# Patient Record
Sex: Female | Born: 1945 | Race: White | Hispanic: No | Marital: Married | Smoking: Never smoker
Health system: Southern US, Community
[De-identification: ages and names within clinical notes are randomized; demographics above are authoritative.]

## PROBLEM LIST (undated history)

## (undated) DIAGNOSIS — R600 Localized edema: Secondary | ICD-10-CM

## (undated) DIAGNOSIS — B372 Candidiasis of skin and nail: Secondary | ICD-10-CM

## (undated) DIAGNOSIS — C4491 Basal cell carcinoma of skin, unspecified: Secondary | ICD-10-CM

## (undated) DIAGNOSIS — J209 Acute bronchitis, unspecified: Secondary | ICD-10-CM

## (undated) DIAGNOSIS — E785 Hyperlipidemia, unspecified: Secondary | ICD-10-CM

## (undated) DIAGNOSIS — K219 Gastro-esophageal reflux disease without esophagitis: Secondary | ICD-10-CM

## (undated) DIAGNOSIS — G5600 Carpal tunnel syndrome, unspecified upper limb: Secondary | ICD-10-CM

## (undated) DIAGNOSIS — E559 Vitamin D deficiency, unspecified: Secondary | ICD-10-CM

## (undated) DIAGNOSIS — F32A Depression, unspecified: Secondary | ICD-10-CM

## (undated) DIAGNOSIS — M1711 Unilateral primary osteoarthritis, right knee: Secondary | ICD-10-CM

## (undated) DIAGNOSIS — R112 Nausea with vomiting, unspecified: Secondary | ICD-10-CM

## (undated) DIAGNOSIS — J309 Allergic rhinitis, unspecified: Secondary | ICD-10-CM

## (undated) DIAGNOSIS — T8859XA Other complications of anesthesia, initial encounter: Secondary | ICD-10-CM

## (undated) DIAGNOSIS — N3281 Overactive bladder: Secondary | ICD-10-CM

## (undated) DIAGNOSIS — Z9889 Other specified postprocedural states: Secondary | ICD-10-CM

## (undated) DIAGNOSIS — J449 Chronic obstructive pulmonary disease, unspecified: Secondary | ICD-10-CM

## (undated) DIAGNOSIS — R0600 Dyspnea, unspecified: Secondary | ICD-10-CM

## (undated) DIAGNOSIS — E538 Deficiency of other specified B group vitamins: Secondary | ICD-10-CM

## (undated) DIAGNOSIS — T4145XA Adverse effect of unspecified anesthetic, initial encounter: Secondary | ICD-10-CM

## (undated) DIAGNOSIS — J45909 Unspecified asthma, uncomplicated: Secondary | ICD-10-CM

## (undated) DIAGNOSIS — R7303 Prediabetes: Secondary | ICD-10-CM

## (undated) DIAGNOSIS — J040 Acute laryngitis: Secondary | ICD-10-CM

## (undated) DIAGNOSIS — I447 Left bundle-branch block, unspecified: Secondary | ICD-10-CM

## (undated) DIAGNOSIS — F419 Anxiety disorder, unspecified: Secondary | ICD-10-CM

## (undated) DIAGNOSIS — F329 Major depressive disorder, single episode, unspecified: Secondary | ICD-10-CM

## (undated) HISTORY — DX: Candidiasis of skin and nail: B37.2

## (undated) HISTORY — DX: Chronic obstructive pulmonary disease, unspecified: J44.9

## (undated) HISTORY — DX: Deficiency of other specified B group vitamins: E53.8

## (undated) HISTORY — DX: Vitamin D deficiency, unspecified: E55.9

## (undated) HISTORY — DX: Unilateral primary osteoarthritis, right knee: M17.11

## (undated) HISTORY — DX: Anxiety disorder, unspecified: F41.9

## (undated) HISTORY — DX: Acute laryngitis: J04.0

## (undated) HISTORY — DX: Allergic rhinitis, unspecified: J30.9

## (undated) HISTORY — DX: Hyperlipidemia, unspecified: E78.5

## (undated) HISTORY — DX: Overactive bladder: N32.81

## (undated) HISTORY — DX: Depression, unspecified: F32.A

## (undated) HISTORY — DX: Localized edema: R60.0

## (undated) HISTORY — DX: Gastro-esophageal reflux disease without esophagitis: K21.9

## (undated) HISTORY — DX: Major depressive disorder, single episode, unspecified: F32.9

## (undated) HISTORY — PX: KNEE SURGERY: SHX244

## (undated) HISTORY — DX: Unspecified asthma, uncomplicated: J45.909

## (undated) HISTORY — PX: JOINT REPLACEMENT: SHX530

## (undated) HISTORY — PX: OTHER SURGICAL HISTORY: SHX169

## (undated) HISTORY — DX: Acute bronchitis, unspecified: J20.9

---

## 1973-01-12 HISTORY — PX: PARTIAL HYSTERECTOMY: SHX80

## 1973-01-12 HISTORY — PX: ABDOMINAL HYSTERECTOMY: SHX81

## 1998-01-12 HISTORY — PX: CARPAL TUNNEL RELEASE: SHX101

## 2003-05-01 ENCOUNTER — Encounter: Payer: Self-pay | Admitting: Internal Medicine

## 2003-12-28 ENCOUNTER — Ambulatory Visit: Payer: Self-pay | Admitting: Internal Medicine

## 2005-10-08 ENCOUNTER — Ambulatory Visit: Payer: Self-pay | Admitting: Internal Medicine

## 2006-09-06 ENCOUNTER — Encounter: Payer: Self-pay | Admitting: Internal Medicine

## 2006-10-08 ENCOUNTER — Ambulatory Visit (HOSPITAL_BASED_OUTPATIENT_CLINIC_OR_DEPARTMENT_OTHER): Admission: RE | Admit: 2006-10-08 | Discharge: 2006-10-08 | Payer: Self-pay | Admitting: Orthopedic Surgery

## 2007-05-16 ENCOUNTER — Encounter: Payer: Self-pay | Admitting: Internal Medicine

## 2007-11-09 ENCOUNTER — Ambulatory Visit: Payer: Self-pay | Admitting: Internal Medicine

## 2008-04-23 ENCOUNTER — Ambulatory Visit: Payer: Self-pay | Admitting: Internal Medicine

## 2008-04-23 DIAGNOSIS — IMO0002 Reserved for concepts with insufficient information to code with codable children: Secondary | ICD-10-CM | POA: Insufficient documentation

## 2008-04-23 DIAGNOSIS — K5730 Diverticulosis of large intestine without perforation or abscess without bleeding: Secondary | ICD-10-CM | POA: Insufficient documentation

## 2008-04-23 LAB — CONVERTED CEMR LAB
Blood in Urine, dipstick: NEGATIVE
Glucose, Urine, Semiquant: NEGATIVE
Nitrite: NEGATIVE
Specific Gravity, Urine: 1.01
WBC Urine, dipstick: NEGATIVE
pH: 6

## 2008-05-02 ENCOUNTER — Encounter (INDEPENDENT_AMBULATORY_CARE_PROVIDER_SITE_OTHER): Payer: Self-pay | Admitting: *Deleted

## 2008-05-02 ENCOUNTER — Telehealth (INDEPENDENT_AMBULATORY_CARE_PROVIDER_SITE_OTHER): Payer: Self-pay | Admitting: *Deleted

## 2008-05-02 LAB — CONVERTED CEMR LAB
ALT: 22 units/L (ref 0–35)
AST: 31 units/L (ref 0–37)
Albumin: 4.1 g/dL (ref 3.5–5.2)
Chloride: 107 meq/L (ref 96–112)
Eosinophils Relative: 3.7 % (ref 0.0–5.0)
GFR calc non Af Amer: 66.9 mL/min (ref >60)
Glucose, Bld: 97 mg/dL (ref 70–99)
HCT: 40.5 % (ref 36.0–46.0)
HDL: 57.8 mg/dL (ref >39.00)
Hemoglobin: 14 g/dL (ref 12.0–15.0)
Lymphs Abs: 2.4 10*3/uL (ref 0.7–4.0)
MCV: 92.5 fL (ref 78.0–100.0)
Monocytes Absolute: 0.4 10*3/uL (ref 0.1–1.0)
Monocytes Relative: 7.6 % (ref 3.0–12.0)
Neutro Abs: 2.8 10*3/uL (ref 1.4–7.7)
Potassium: 4.9 meq/L (ref 3.5–5.1)
Sodium: 144 meq/L (ref 135–145)
TSH: 2.23 microintl units/mL (ref 0.35–5.50)
Total Protein: 7.4 g/dL (ref 6.0–8.3)
VLDL: 16.2 mg/dL (ref 0.0–40.0)
WBC: 5.9 10*3/uL (ref 4.5–10.5)

## 2008-09-21 ENCOUNTER — Ambulatory Visit (HOSPITAL_BASED_OUTPATIENT_CLINIC_OR_DEPARTMENT_OTHER): Admission: RE | Admit: 2008-09-21 | Discharge: 2008-09-21 | Payer: Self-pay | Admitting: Orthopedic Surgery

## 2010-05-27 NOTE — Op Note (Signed)
NAMEAYAN, YANKEY                 ACCOUNT NO.:  [id number]   MEDICAL RECORD NO.:  [id number]          PATIENT TYPE:  AMB   LOCATION:  DSC                          FACILITY:  [facility]   PHYSICIAN:  Katy Fitch. Sypher, M.D. DATE OF BIRTH:  [birth year]   DATE OF PROCEDURE:  10/08/2006  DATE OF DISCHARGE:                               OPERATIVE REPORT   PREOPERATIVE DIAGNOSES:  1. 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  2. Entrapped neuropathy, right median nerve at carpal tunnel.   POSTOPERATIVE DIAGNOSES:  1. 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  2. Entrapped neuropathy, right median nerve at carpal tunnel.   OPERATION/PROCEDURE:  1. Release of left transverse carpal ligament.  2. Injection of right ulnar bursa with Depo-Medrol and 1% lidocaine      without epinephrine.   OPERATIONS:  Katy Fitch. Sypher, M.D.   ASSISTANT:  Molly Maduro Dasnoit PA-C.   ANESTHESIA:  General by LMA.   SUPERVISING ANESTHESIOLOGIST:  Quita Skye. Krista Blue, M.D.   INDICATIONS:  Janet Diaz is a 65 year old woman referred through the  courtesy of Dr. Lona Kettle for evaluation and management of bilateral  carpal tunnel syndrome.  Clinical examination revealed signs of  bilateral carpal tunnel syndrome.  Electrodiagnostic studies confirmed  median neuropathy.  Due to failed respond to nonoperative measures, she  is brought to operating room at this time for release of her left  transverse carpal ligament and an injection of her right ulnar bursa  while under anesthesia.   Preoperatively she was interviewed in the holding area.  Questions  regarding anticipated procedure invited and answered in detail.   DESCRIPTION OF PROCEDURE:  Uliana Brinker is brought to the operating room  and placed in supine position on the table.  Following anesthesia  consult with Dr. Krista Blue, general anesthesia by LMA technique was  recommended and accepted.   Mrs. Brys was transferred 65  to room #1 one at the [facility],  placed in supine position on the table and under Dr. Robina Ade direct  supervision, general anesthesia by LMA technique induced.   Her left arm was prepped with Betadine soap and solution, sterilely  draped.  A pneumatic tourniquet was applied to the proximal brachium.  While prep was proceeding on the left side, I proceeded to inject the  right ulnar bursa with her fingers in a fist position in the line of the  ring finger beginning 1.5 cm proximal to the distal wrist flexion  crease. The needle was directed towards the ulnar bursa.  The fingers  were extended and a 2 mL mixture of 0.5 mL of Depo-Medrol 40 mg per mL  and 1.5 mL of 1% plain lidocaine were injected without complication.   Attention then directed to the left arm.  Left arm was exsanguinated  with Esmarch bandage and an arterial tourniquet on the proximal brachium  inflated to 230 mmHg.  Procedure commenced with a short incision in line  of the ring finger of the palm.  Subcutaneous tissues were carefully via  the palmar fascia.  This was split  longitudinally to the common sensory  branches of the median nerve.  These were followed back to transcarpal  ligament which gently isolated the median nerve.  The ligament was  released with scissors sequentially along its ulnar border until the  volar forearm fascia was reached.  The fascia was then released  subcutaneously with scissors extending into the distal forearm 4 cm.   This widely opened carpal canal.  The contents of the canal inspected.  The ulnar bursa was thickened and fibrotic.  The bursa was opaque,  rendering visualization of the tendons challenging.  There no other  anatomic predicaments noted.   The wound was then repaired with intradermal 3-0 Prolene.  A compressive  dressing was applied with a volar plaster splint maintaining the wrist  in 5 degrees of dorsiflexion.   For aftercare, Ms. Pfeifer is provided a  prescription for Percocet 5 mg 1  tablet p.o. q.4-6 h p.r.n. pain, 20 tablets without refill.   She will return to our office for followup in 1 week or sooner if  problems.      Katy Fitch Sypher, M.D.  Electronically Signed     RVS/MEDQ  D:  10/08/2006  T:  10/08/2006  Job:  [id number]   cc:   Titus Dubin. Alwyn Ren, MD,FACP,FCCP

## 2010-10-23 LAB — POCT HEMOGLOBIN-HEMACUE: Operator id: 208731

## 2011-01-15 DIAGNOSIS — E538 Deficiency of other specified B group vitamins: Secondary | ICD-10-CM | POA: Diagnosis not present

## 2011-03-16 DIAGNOSIS — D510 Vitamin B12 deficiency anemia due to intrinsic factor deficiency: Secondary | ICD-10-CM | POA: Diagnosis not present

## 2011-03-16 DIAGNOSIS — J040 Acute laryngitis: Secondary | ICD-10-CM | POA: Diagnosis not present

## 2011-03-16 DIAGNOSIS — F39 Unspecified mood [affective] disorder: Secondary | ICD-10-CM | POA: Diagnosis not present

## 2011-03-16 DIAGNOSIS — J309 Allergic rhinitis, unspecified: Secondary | ICD-10-CM | POA: Diagnosis not present

## 2011-05-12 DIAGNOSIS — F39 Unspecified mood [affective] disorder: Secondary | ICD-10-CM | POA: Diagnosis not present

## 2011-05-12 DIAGNOSIS — E538 Deficiency of other specified B group vitamins: Secondary | ICD-10-CM | POA: Diagnosis not present

## 2011-05-12 DIAGNOSIS — M1710 Unilateral primary osteoarthritis, unspecified knee: Secondary | ICD-10-CM | POA: Diagnosis not present

## 2011-05-12 DIAGNOSIS — G4700 Insomnia, unspecified: Secondary | ICD-10-CM | POA: Diagnosis not present

## 2011-06-04 DIAGNOSIS — Z6837 Body mass index (BMI) 37.0-37.9, adult: Secondary | ICD-10-CM | POA: Diagnosis not present

## 2011-06-04 DIAGNOSIS — J040 Acute laryngitis: Secondary | ICD-10-CM | POA: Diagnosis not present

## 2011-06-04 DIAGNOSIS — J309 Allergic rhinitis, unspecified: Secondary | ICD-10-CM | POA: Diagnosis not present

## 2011-06-04 DIAGNOSIS — J019 Acute sinusitis, unspecified: Secondary | ICD-10-CM | POA: Diagnosis not present

## 2011-07-02 DIAGNOSIS — F39 Unspecified mood [affective] disorder: Secondary | ICD-10-CM | POA: Diagnosis not present

## 2011-07-02 DIAGNOSIS — N318 Other neuromuscular dysfunction of bladder: Secondary | ICD-10-CM | POA: Diagnosis not present

## 2011-07-02 DIAGNOSIS — E538 Deficiency of other specified B group vitamins: Secondary | ICD-10-CM | POA: Diagnosis not present

## 2011-07-02 DIAGNOSIS — E782 Mixed hyperlipidemia: Secondary | ICD-10-CM | POA: Diagnosis not present

## 2011-09-08 DIAGNOSIS — L570 Actinic keratosis: Secondary | ICD-10-CM | POA: Diagnosis not present

## 2011-09-08 DIAGNOSIS — L821 Other seborrheic keratosis: Secondary | ICD-10-CM | POA: Diagnosis not present

## 2011-10-01 DIAGNOSIS — R928 Other abnormal and inconclusive findings on diagnostic imaging of breast: Secondary | ICD-10-CM | POA: Diagnosis not present

## 2011-12-29 DIAGNOSIS — R609 Edema, unspecified: Secondary | ICD-10-CM | POA: Diagnosis not present

## 2011-12-29 DIAGNOSIS — Z79899 Other long term (current) drug therapy: Secondary | ICD-10-CM | POA: Diagnosis not present

## 2011-12-29 DIAGNOSIS — Z6837 Body mass index (BMI) 37.0-37.9, adult: Secondary | ICD-10-CM | POA: Diagnosis not present

## 2011-12-29 DIAGNOSIS — J309 Allergic rhinitis, unspecified: Secondary | ICD-10-CM | POA: Diagnosis not present

## 2011-12-29 DIAGNOSIS — F39 Unspecified mood [affective] disorder: Secondary | ICD-10-CM | POA: Diagnosis not present

## 2012-01-13 HISTORY — PX: WRIST SURGERY: SHX841

## 2012-01-13 HISTORY — PX: CATARACT EXTRACTION: SUR2

## 2012-01-18 DIAGNOSIS — J019 Acute sinusitis, unspecified: Secondary | ICD-10-CM | POA: Diagnosis not present

## 2012-01-18 DIAGNOSIS — J029 Acute pharyngitis, unspecified: Secondary | ICD-10-CM | POA: Diagnosis not present

## 2012-01-18 DIAGNOSIS — R920 Mammographic microcalcification found on diagnostic imaging of breast: Secondary | ICD-10-CM | POA: Diagnosis not present

## 2012-01-18 DIAGNOSIS — R928 Other abnormal and inconclusive findings on diagnostic imaging of breast: Secondary | ICD-10-CM | POA: Diagnosis not present

## 2012-01-18 DIAGNOSIS — R509 Fever, unspecified: Secondary | ICD-10-CM | POA: Diagnosis not present

## 2012-01-24 DIAGNOSIS — M546 Pain in thoracic spine: Secondary | ICD-10-CM | POA: Diagnosis not present

## 2012-01-24 DIAGNOSIS — S52599A Other fractures of lower end of unspecified radius, initial encounter for closed fracture: Secondary | ICD-10-CM | POA: Diagnosis not present

## 2012-01-24 DIAGNOSIS — Z23 Encounter for immunization: Secondary | ICD-10-CM | POA: Diagnosis not present

## 2012-01-24 DIAGNOSIS — IMO0002 Reserved for concepts with insufficient information to code with codable children: Secondary | ICD-10-CM | POA: Diagnosis not present

## 2012-01-24 DIAGNOSIS — W010XXA Fall on same level from slipping, tripping and stumbling without subsequent striking against object, initial encounter: Secondary | ICD-10-CM | POA: Diagnosis not present

## 2012-01-24 DIAGNOSIS — M549 Dorsalgia, unspecified: Secondary | ICD-10-CM | POA: Diagnosis not present

## 2012-01-26 DIAGNOSIS — S52599A Other fractures of lower end of unspecified radius, initial encounter for closed fracture: Secondary | ICD-10-CM | POA: Diagnosis not present

## 2012-01-27 DIAGNOSIS — Y939 Activity, unspecified: Secondary | ICD-10-CM | POA: Diagnosis not present

## 2012-01-27 DIAGNOSIS — X58XXXA Exposure to other specified factors, initial encounter: Secondary | ICD-10-CM | POA: Diagnosis not present

## 2012-01-27 DIAGNOSIS — Y929 Unspecified place or not applicable: Secondary | ICD-10-CM | POA: Diagnosis not present

## 2012-01-27 DIAGNOSIS — S52599A Other fractures of lower end of unspecified radius, initial encounter for closed fracture: Secondary | ICD-10-CM | POA: Diagnosis not present

## 2012-01-27 DIAGNOSIS — Y999 Unspecified external cause status: Secondary | ICD-10-CM | POA: Diagnosis not present

## 2012-02-09 DIAGNOSIS — S52599A Other fractures of lower end of unspecified radius, initial encounter for closed fracture: Secondary | ICD-10-CM | POA: Diagnosis not present

## 2012-02-23 DIAGNOSIS — S52599A Other fractures of lower end of unspecified radius, initial encounter for closed fracture: Secondary | ICD-10-CM | POA: Diagnosis not present

## 2012-02-29 DIAGNOSIS — M545 Low back pain: Secondary | ICD-10-CM | POA: Diagnosis not present

## 2012-03-02 DIAGNOSIS — E559 Vitamin D deficiency, unspecified: Secondary | ICD-10-CM | POA: Diagnosis not present

## 2012-03-02 DIAGNOSIS — E785 Hyperlipidemia, unspecified: Secondary | ICD-10-CM | POA: Diagnosis not present

## 2012-03-02 DIAGNOSIS — Z0000 Encounter for general adult medical examination without abnormal findings: Secondary | ICD-10-CM | POA: Diagnosis not present

## 2012-03-07 DIAGNOSIS — M899 Disorder of bone, unspecified: Secondary | ICD-10-CM | POA: Diagnosis not present

## 2012-03-07 DIAGNOSIS — Z13820 Encounter for screening for osteoporosis: Secondary | ICD-10-CM | POA: Diagnosis not present

## 2012-03-10 DIAGNOSIS — E538 Deficiency of other specified B group vitamins: Secondary | ICD-10-CM | POA: Diagnosis not present

## 2012-03-10 DIAGNOSIS — L905 Scar conditions and fibrosis of skin: Secondary | ICD-10-CM | POA: Diagnosis not present

## 2012-03-10 DIAGNOSIS — M545 Low back pain, unspecified: Secondary | ICD-10-CM | POA: Diagnosis not present

## 2012-03-22 DIAGNOSIS — S52599A Other fractures of lower end of unspecified radius, initial encounter for closed fracture: Secondary | ICD-10-CM | POA: Diagnosis not present

## 2012-03-22 DIAGNOSIS — G5600 Carpal tunnel syndrome, unspecified upper limb: Secondary | ICD-10-CM | POA: Diagnosis not present

## 2012-03-22 DIAGNOSIS — M19049 Primary osteoarthritis, unspecified hand: Secondary | ICD-10-CM | POA: Diagnosis not present

## 2012-04-05 DIAGNOSIS — L570 Actinic keratosis: Secondary | ICD-10-CM | POA: Diagnosis not present

## 2012-04-13 DIAGNOSIS — M5430 Sciatica, unspecified side: Secondary | ICD-10-CM | POA: Diagnosis not present

## 2012-04-13 DIAGNOSIS — IMO0002 Reserved for concepts with insufficient information to code with codable children: Secondary | ICD-10-CM | POA: Diagnosis not present

## 2012-04-21 DIAGNOSIS — S52599A Other fractures of lower end of unspecified radius, initial encounter for closed fracture: Secondary | ICD-10-CM | POA: Diagnosis not present

## 2012-05-30 DIAGNOSIS — L989 Disorder of the skin and subcutaneous tissue, unspecified: Secondary | ICD-10-CM | POA: Diagnosis not present

## 2012-05-30 DIAGNOSIS — J309 Allergic rhinitis, unspecified: Secondary | ICD-10-CM | POA: Diagnosis not present

## 2012-05-30 DIAGNOSIS — Z6837 Body mass index (BMI) 37.0-37.9, adult: Secondary | ICD-10-CM | POA: Diagnosis not present

## 2012-05-30 DIAGNOSIS — K219 Gastro-esophageal reflux disease without esophagitis: Secondary | ICD-10-CM | POA: Diagnosis not present

## 2012-06-10 DIAGNOSIS — J0100 Acute maxillary sinusitis, unspecified: Secondary | ICD-10-CM | POA: Diagnosis not present

## 2012-06-10 DIAGNOSIS — J209 Acute bronchitis, unspecified: Secondary | ICD-10-CM | POA: Diagnosis not present

## 2012-06-10 DIAGNOSIS — J029 Acute pharyngitis, unspecified: Secondary | ICD-10-CM | POA: Diagnosis not present

## 2012-09-07 DIAGNOSIS — M545 Low back pain: Secondary | ICD-10-CM | POA: Diagnosis not present

## 2012-09-07 DIAGNOSIS — Z79899 Other long term (current) drug therapy: Secondary | ICD-10-CM | POA: Diagnosis not present

## 2012-09-07 DIAGNOSIS — M1710 Unilateral primary osteoarthritis, unspecified knee: Secondary | ICD-10-CM | POA: Diagnosis not present

## 2012-09-07 DIAGNOSIS — E785 Hyperlipidemia, unspecified: Secondary | ICD-10-CM | POA: Diagnosis not present

## 2012-09-07 DIAGNOSIS — F39 Unspecified mood [affective] disorder: Secondary | ICD-10-CM | POA: Diagnosis not present

## 2012-09-07 DIAGNOSIS — E559 Vitamin D deficiency, unspecified: Secondary | ICD-10-CM | POA: Diagnosis not present

## 2012-09-26 DIAGNOSIS — Z2089 Contact with and (suspected) exposure to other communicable diseases: Secondary | ICD-10-CM | POA: Diagnosis not present

## 2012-10-17 DIAGNOSIS — H2589 Other age-related cataract: Secondary | ICD-10-CM | POA: Diagnosis not present

## 2012-10-20 DIAGNOSIS — M6281 Muscle weakness (generalized): Secondary | ICD-10-CM | POA: Diagnosis not present

## 2012-10-20 DIAGNOSIS — M2560 Stiffness of unspecified joint, not elsewhere classified: Secondary | ICD-10-CM | POA: Diagnosis not present

## 2012-10-20 DIAGNOSIS — R209 Unspecified disturbances of skin sensation: Secondary | ICD-10-CM | POA: Diagnosis not present

## 2012-10-20 DIAGNOSIS — M545 Low back pain: Secondary | ICD-10-CM | POA: Diagnosis not present

## 2012-10-20 DIAGNOSIS — M79609 Pain in unspecified limb: Secondary | ICD-10-CM | POA: Diagnosis not present

## 2012-10-20 DIAGNOSIS — R293 Abnormal posture: Secondary | ICD-10-CM | POA: Diagnosis not present

## 2012-10-20 DIAGNOSIS — Z9181 History of falling: Secondary | ICD-10-CM | POA: Diagnosis not present

## 2012-10-20 DIAGNOSIS — IMO0001 Reserved for inherently not codable concepts without codable children: Secondary | ICD-10-CM | POA: Diagnosis not present

## 2012-10-26 DIAGNOSIS — IMO0001 Reserved for inherently not codable concepts without codable children: Secondary | ICD-10-CM | POA: Diagnosis not present

## 2012-10-26 DIAGNOSIS — M545 Low back pain: Secondary | ICD-10-CM | POA: Diagnosis not present

## 2012-10-26 DIAGNOSIS — R293 Abnormal posture: Secondary | ICD-10-CM | POA: Diagnosis not present

## 2012-10-26 DIAGNOSIS — M2560 Stiffness of unspecified joint, not elsewhere classified: Secondary | ICD-10-CM | POA: Diagnosis not present

## 2012-10-26 DIAGNOSIS — M79609 Pain in unspecified limb: Secondary | ICD-10-CM | POA: Diagnosis not present

## 2012-10-26 DIAGNOSIS — Z9181 History of falling: Secondary | ICD-10-CM | POA: Diagnosis not present

## 2012-12-05 DIAGNOSIS — H52209 Unspecified astigmatism, unspecified eye: Secondary | ICD-10-CM | POA: Diagnosis not present

## 2012-12-05 DIAGNOSIS — H2589 Other age-related cataract: Secondary | ICD-10-CM | POA: Diagnosis not present

## 2012-12-05 DIAGNOSIS — H2510 Age-related nuclear cataract, unspecified eye: Secondary | ICD-10-CM | POA: Diagnosis not present

## 2012-12-13 DIAGNOSIS — H68009 Unspecified Eustachian salpingitis, unspecified ear: Secondary | ICD-10-CM | POA: Diagnosis not present

## 2012-12-13 DIAGNOSIS — J309 Allergic rhinitis, unspecified: Secondary | ICD-10-CM | POA: Diagnosis not present

## 2013-01-10 DIAGNOSIS — K219 Gastro-esophageal reflux disease without esophagitis: Secondary | ICD-10-CM | POA: Diagnosis not present

## 2013-01-10 DIAGNOSIS — Z23 Encounter for immunization: Secondary | ICD-10-CM | POA: Diagnosis not present

## 2013-01-10 DIAGNOSIS — E559 Vitamin D deficiency, unspecified: Secondary | ICD-10-CM | POA: Diagnosis not present

## 2013-01-10 DIAGNOSIS — B372 Candidiasis of skin and nail: Secondary | ICD-10-CM | POA: Diagnosis not present

## 2013-01-10 DIAGNOSIS — E785 Hyperlipidemia, unspecified: Secondary | ICD-10-CM | POA: Diagnosis not present

## 2013-02-07 DIAGNOSIS — M5137 Other intervertebral disc degeneration, lumbosacral region: Secondary | ICD-10-CM | POA: Diagnosis not present

## 2013-02-07 DIAGNOSIS — M25569 Pain in unspecified knee: Secondary | ICD-10-CM | POA: Diagnosis not present

## 2013-02-07 DIAGNOSIS — IMO0001 Reserved for inherently not codable concepts without codable children: Secondary | ICD-10-CM | POA: Diagnosis not present

## 2013-02-07 DIAGNOSIS — M51379 Other intervertebral disc degeneration, lumbosacral region without mention of lumbar back pain or lower extremity pain: Secondary | ICD-10-CM | POA: Diagnosis not present

## 2013-03-13 DIAGNOSIS — IMO0002 Reserved for concepts with insufficient information to code with codable children: Secondary | ICD-10-CM | POA: Diagnosis not present

## 2013-03-13 DIAGNOSIS — Z1231 Encounter for screening mammogram for malignant neoplasm of breast: Secondary | ICD-10-CM | POA: Diagnosis not present

## 2013-03-13 DIAGNOSIS — M1710 Unilateral primary osteoarthritis, unspecified knee: Secondary | ICD-10-CM | POA: Diagnosis not present

## 2013-04-04 DIAGNOSIS — E559 Vitamin D deficiency, unspecified: Secondary | ICD-10-CM | POA: Diagnosis not present

## 2013-04-04 DIAGNOSIS — Z79899 Other long term (current) drug therapy: Secondary | ICD-10-CM | POA: Diagnosis not present

## 2013-04-04 DIAGNOSIS — J309 Allergic rhinitis, unspecified: Secondary | ICD-10-CM | POA: Diagnosis not present

## 2013-04-04 DIAGNOSIS — K219 Gastro-esophageal reflux disease without esophagitis: Secondary | ICD-10-CM | POA: Diagnosis not present

## 2013-04-04 DIAGNOSIS — F39 Unspecified mood [affective] disorder: Secondary | ICD-10-CM | POA: Diagnosis not present

## 2013-04-04 DIAGNOSIS — E785 Hyperlipidemia, unspecified: Secondary | ICD-10-CM | POA: Diagnosis not present

## 2013-07-27 DIAGNOSIS — K219 Gastro-esophageal reflux disease without esophagitis: Secondary | ICD-10-CM | POA: Diagnosis not present

## 2013-07-27 DIAGNOSIS — H68009 Unspecified Eustachian salpingitis, unspecified ear: Secondary | ICD-10-CM | POA: Diagnosis not present

## 2013-07-27 DIAGNOSIS — R42 Dizziness and giddiness: Secondary | ICD-10-CM | POA: Diagnosis not present

## 2013-07-27 DIAGNOSIS — J309 Allergic rhinitis, unspecified: Secondary | ICD-10-CM | POA: Diagnosis not present

## 2013-08-14 DIAGNOSIS — E782 Mixed hyperlipidemia: Secondary | ICD-10-CM | POA: Diagnosis not present

## 2013-08-14 DIAGNOSIS — E559 Vitamin D deficiency, unspecified: Secondary | ICD-10-CM | POA: Diagnosis not present

## 2013-08-14 DIAGNOSIS — Z0000 Encounter for general adult medical examination without abnormal findings: Secondary | ICD-10-CM | POA: Diagnosis not present

## 2013-08-14 DIAGNOSIS — Z124 Encounter for screening for malignant neoplasm of cervix: Secondary | ICD-10-CM | POA: Diagnosis not present

## 2013-11-16 DIAGNOSIS — F39 Unspecified mood [affective] disorder: Secondary | ICD-10-CM | POA: Diagnosis not present

## 2013-11-16 DIAGNOSIS — E559 Vitamin D deficiency, unspecified: Secondary | ICD-10-CM | POA: Diagnosis not present

## 2013-11-16 DIAGNOSIS — G4700 Insomnia, unspecified: Secondary | ICD-10-CM | POA: Diagnosis not present

## 2013-11-16 DIAGNOSIS — N3281 Overactive bladder: Secondary | ICD-10-CM | POA: Diagnosis not present

## 2013-11-16 DIAGNOSIS — J309 Allergic rhinitis, unspecified: Secondary | ICD-10-CM | POA: Diagnosis not present

## 2013-11-16 DIAGNOSIS — E538 Deficiency of other specified B group vitamins: Secondary | ICD-10-CM | POA: Diagnosis not present

## 2013-11-16 DIAGNOSIS — R739 Hyperglycemia, unspecified: Secondary | ICD-10-CM | POA: Diagnosis not present

## 2013-11-16 DIAGNOSIS — Z1389 Encounter for screening for other disorder: Secondary | ICD-10-CM | POA: Diagnosis not present

## 2013-11-16 DIAGNOSIS — Z9181 History of falling: Secondary | ICD-10-CM | POA: Diagnosis not present

## 2013-11-16 DIAGNOSIS — E785 Hyperlipidemia, unspecified: Secondary | ICD-10-CM | POA: Diagnosis not present

## 2013-12-25 DIAGNOSIS — H68009 Unspecified Eustachian salpingitis, unspecified ear: Secondary | ICD-10-CM | POA: Diagnosis not present

## 2013-12-25 DIAGNOSIS — J309 Allergic rhinitis, unspecified: Secondary | ICD-10-CM | POA: Diagnosis not present

## 2013-12-25 DIAGNOSIS — J040 Acute laryngitis: Secondary | ICD-10-CM | POA: Diagnosis not present

## 2014-01-12 HISTORY — PX: FINGER SURGERY: SHX640

## 2014-02-21 DIAGNOSIS — M25562 Pain in left knee: Secondary | ICD-10-CM | POA: Diagnosis not present

## 2014-02-21 DIAGNOSIS — S93432A Sprain of tibiofibular ligament of left ankle, initial encounter: Secondary | ICD-10-CM | POA: Diagnosis not present

## 2014-02-21 DIAGNOSIS — S8012XA Contusion of left lower leg, initial encounter: Secondary | ICD-10-CM | POA: Diagnosis not present

## 2014-02-22 DIAGNOSIS — F419 Anxiety disorder, unspecified: Secondary | ICD-10-CM | POA: Diagnosis not present

## 2014-02-22 DIAGNOSIS — K219 Gastro-esophageal reflux disease without esophagitis: Secondary | ICD-10-CM | POA: Diagnosis not present

## 2014-02-22 DIAGNOSIS — N3281 Overactive bladder: Secondary | ICD-10-CM | POA: Diagnosis not present

## 2014-02-22 DIAGNOSIS — Z23 Encounter for immunization: Secondary | ICD-10-CM | POA: Diagnosis not present

## 2014-02-22 DIAGNOSIS — Z9181 History of falling: Secondary | ICD-10-CM | POA: Diagnosis not present

## 2014-02-22 DIAGNOSIS — M179 Osteoarthritis of knee, unspecified: Secondary | ICD-10-CM | POA: Diagnosis not present

## 2014-02-22 DIAGNOSIS — E785 Hyperlipidemia, unspecified: Secondary | ICD-10-CM | POA: Diagnosis not present

## 2014-02-22 DIAGNOSIS — S31000A Unspecified open wound of lower back and pelvis without penetration into retroperitoneum, initial encounter: Secondary | ICD-10-CM | POA: Diagnosis not present

## 2014-02-22 DIAGNOSIS — R739 Hyperglycemia, unspecified: Secondary | ICD-10-CM | POA: Diagnosis not present

## 2014-02-22 DIAGNOSIS — E559 Vitamin D deficiency, unspecified: Secondary | ICD-10-CM | POA: Diagnosis not present

## 2014-02-22 DIAGNOSIS — Z6834 Body mass index (BMI) 34.0-34.9, adult: Secondary | ICD-10-CM | POA: Diagnosis not present

## 2014-02-22 DIAGNOSIS — J309 Allergic rhinitis, unspecified: Secondary | ICD-10-CM | POA: Diagnosis not present

## 2014-02-22 DIAGNOSIS — Z79899 Other long term (current) drug therapy: Secondary | ICD-10-CM | POA: Diagnosis not present

## 2014-03-15 DIAGNOSIS — Z6834 Body mass index (BMI) 34.0-34.9, adult: Secondary | ICD-10-CM | POA: Diagnosis not present

## 2014-03-15 DIAGNOSIS — M79646 Pain in unspecified finger(s): Secondary | ICD-10-CM | POA: Diagnosis not present

## 2014-03-15 DIAGNOSIS — T148 Other injury of unspecified body region: Secondary | ICD-10-CM | POA: Diagnosis not present

## 2014-04-05 DIAGNOSIS — S64495A Injury of digital nerve of left ring finger, initial encounter: Secondary | ICD-10-CM | POA: Diagnosis not present

## 2014-04-20 DIAGNOSIS — S61315A Laceration without foreign body of left ring finger with damage to nail, initial encounter: Secondary | ICD-10-CM | POA: Diagnosis not present

## 2014-04-20 DIAGNOSIS — Y929 Unspecified place or not applicable: Secondary | ICD-10-CM | POA: Diagnosis not present

## 2014-04-20 DIAGNOSIS — S64492A Injury of digital nerve of right middle finger, initial encounter: Secondary | ICD-10-CM | POA: Diagnosis not present

## 2014-04-20 DIAGNOSIS — X58XXXA Exposure to other specified factors, initial encounter: Secondary | ICD-10-CM | POA: Diagnosis not present

## 2014-07-12 DIAGNOSIS — E785 Hyperlipidemia, unspecified: Secondary | ICD-10-CM | POA: Diagnosis not present

## 2014-07-12 DIAGNOSIS — E559 Vitamin D deficiency, unspecified: Secondary | ICD-10-CM | POA: Diagnosis not present

## 2014-07-12 DIAGNOSIS — M179 Osteoarthritis of knee, unspecified: Secondary | ICD-10-CM | POA: Diagnosis not present

## 2014-07-12 DIAGNOSIS — K219 Gastro-esophageal reflux disease without esophagitis: Secondary | ICD-10-CM | POA: Diagnosis not present

## 2014-07-12 DIAGNOSIS — F4320 Adjustment disorder, unspecified: Secondary | ICD-10-CM | POA: Diagnosis not present

## 2014-07-12 DIAGNOSIS — Z6836 Body mass index (BMI) 36.0-36.9, adult: Secondary | ICD-10-CM | POA: Diagnosis not present

## 2014-07-12 DIAGNOSIS — F39 Unspecified mood [affective] disorder: Secondary | ICD-10-CM | POA: Diagnosis not present

## 2014-07-12 DIAGNOSIS — Z79899 Other long term (current) drug therapy: Secondary | ICD-10-CM | POA: Diagnosis not present

## 2014-10-15 DIAGNOSIS — Z79899 Other long term (current) drug therapy: Secondary | ICD-10-CM | POA: Diagnosis not present

## 2014-10-15 DIAGNOSIS — E785 Hyperlipidemia, unspecified: Secondary | ICD-10-CM | POA: Diagnosis not present

## 2014-10-15 DIAGNOSIS — M8589 Other specified disorders of bone density and structure, multiple sites: Secondary | ICD-10-CM | POA: Diagnosis not present

## 2014-10-15 DIAGNOSIS — Z6835 Body mass index (BMI) 35.0-35.9, adult: Secondary | ICD-10-CM | POA: Diagnosis not present

## 2014-10-15 DIAGNOSIS — Z1212 Encounter for screening for malignant neoplasm of rectum: Secondary | ICD-10-CM | POA: Diagnosis not present

## 2014-10-15 DIAGNOSIS — K219 Gastro-esophageal reflux disease without esophagitis: Secondary | ICD-10-CM | POA: Diagnosis not present

## 2014-10-15 DIAGNOSIS — E559 Vitamin D deficiency, unspecified: Secondary | ICD-10-CM | POA: Diagnosis not present

## 2014-10-15 DIAGNOSIS — Z23 Encounter for immunization: Secondary | ICD-10-CM | POA: Diagnosis not present

## 2014-10-15 DIAGNOSIS — Z0000 Encounter for general adult medical examination without abnormal findings: Secondary | ICD-10-CM | POA: Diagnosis not present

## 2014-10-15 DIAGNOSIS — Z9181 History of falling: Secondary | ICD-10-CM | POA: Diagnosis not present

## 2015-02-14 DIAGNOSIS — K219 Gastro-esophageal reflux disease without esophagitis: Secondary | ICD-10-CM | POA: Diagnosis not present

## 2015-02-14 DIAGNOSIS — E559 Vitamin D deficiency, unspecified: Secondary | ICD-10-CM | POA: Diagnosis not present

## 2015-02-14 DIAGNOSIS — R600 Localized edema: Secondary | ICD-10-CM | POA: Diagnosis not present

## 2015-02-14 DIAGNOSIS — N3281 Overactive bladder: Secondary | ICD-10-CM | POA: Diagnosis not present

## 2015-02-14 DIAGNOSIS — J309 Allergic rhinitis, unspecified: Secondary | ICD-10-CM | POA: Diagnosis not present

## 2015-02-14 DIAGNOSIS — Z79899 Other long term (current) drug therapy: Secondary | ICD-10-CM | POA: Diagnosis not present

## 2015-02-14 DIAGNOSIS — M179 Osteoarthritis of knee, unspecified: Secondary | ICD-10-CM | POA: Diagnosis not present

## 2015-02-14 DIAGNOSIS — F419 Anxiety disorder, unspecified: Secondary | ICD-10-CM | POA: Diagnosis not present

## 2015-02-14 DIAGNOSIS — E669 Obesity, unspecified: Secondary | ICD-10-CM | POA: Diagnosis not present

## 2015-02-14 DIAGNOSIS — Z6835 Body mass index (BMI) 35.0-35.9, adult: Secondary | ICD-10-CM | POA: Diagnosis not present

## 2015-02-14 DIAGNOSIS — E785 Hyperlipidemia, unspecified: Secondary | ICD-10-CM | POA: Diagnosis not present

## 2015-02-28 DIAGNOSIS — L570 Actinic keratosis: Secondary | ICD-10-CM | POA: Diagnosis not present

## 2015-02-28 DIAGNOSIS — C44311 Basal cell carcinoma of skin of nose: Secondary | ICD-10-CM | POA: Diagnosis not present

## 2015-02-28 DIAGNOSIS — D485 Neoplasm of uncertain behavior of skin: Secondary | ICD-10-CM | POA: Diagnosis not present

## 2015-03-13 DIAGNOSIS — C44311 Basal cell carcinoma of skin of nose: Secondary | ICD-10-CM | POA: Diagnosis not present

## 2015-04-24 DIAGNOSIS — Z85828 Personal history of other malignant neoplasm of skin: Secondary | ICD-10-CM | POA: Diagnosis not present

## 2015-05-21 DIAGNOSIS — J309 Allergic rhinitis, unspecified: Secondary | ICD-10-CM | POA: Diagnosis not present

## 2015-05-21 DIAGNOSIS — Z79899 Other long term (current) drug therapy: Secondary | ICD-10-CM | POA: Diagnosis not present

## 2015-05-21 DIAGNOSIS — E785 Hyperlipidemia, unspecified: Secondary | ICD-10-CM | POA: Diagnosis not present

## 2015-05-21 DIAGNOSIS — F39 Unspecified mood [affective] disorder: Secondary | ICD-10-CM | POA: Diagnosis not present

## 2015-05-21 DIAGNOSIS — R600 Localized edema: Secondary | ICD-10-CM | POA: Diagnosis not present

## 2015-05-21 DIAGNOSIS — N3281 Overactive bladder: Secondary | ICD-10-CM | POA: Diagnosis not present

## 2015-05-21 DIAGNOSIS — F418 Other specified anxiety disorders: Secondary | ICD-10-CM | POA: Diagnosis not present

## 2015-05-21 DIAGNOSIS — M1710 Unilateral primary osteoarthritis, unspecified knee: Secondary | ICD-10-CM | POA: Diagnosis not present

## 2015-05-21 DIAGNOSIS — Z6837 Body mass index (BMI) 37.0-37.9, adult: Secondary | ICD-10-CM | POA: Diagnosis not present

## 2015-05-21 DIAGNOSIS — E669 Obesity, unspecified: Secondary | ICD-10-CM | POA: Diagnosis not present

## 2015-05-21 DIAGNOSIS — K219 Gastro-esophageal reflux disease without esophagitis: Secondary | ICD-10-CM | POA: Diagnosis not present

## 2015-07-31 DIAGNOSIS — L821 Other seborrheic keratosis: Secondary | ICD-10-CM | POA: Diagnosis not present

## 2015-07-31 DIAGNOSIS — Z85828 Personal history of other malignant neoplasm of skin: Secondary | ICD-10-CM | POA: Diagnosis not present

## 2015-07-31 DIAGNOSIS — L570 Actinic keratosis: Secondary | ICD-10-CM | POA: Diagnosis not present

## 2015-08-29 DIAGNOSIS — E785 Hyperlipidemia, unspecified: Secondary | ICD-10-CM | POA: Diagnosis not present

## 2015-08-29 DIAGNOSIS — M25461 Effusion, right knee: Secondary | ICD-10-CM | POA: Diagnosis not present

## 2015-08-29 DIAGNOSIS — Z79899 Other long term (current) drug therapy: Secondary | ICD-10-CM | POA: Diagnosis not present

## 2015-08-29 DIAGNOSIS — F418 Other specified anxiety disorders: Secondary | ICD-10-CM | POA: Diagnosis not present

## 2015-08-29 DIAGNOSIS — M7989 Other specified soft tissue disorders: Secondary | ICD-10-CM | POA: Diagnosis not present

## 2015-08-29 DIAGNOSIS — I10 Essential (primary) hypertension: Secondary | ICD-10-CM | POA: Diagnosis not present

## 2015-08-29 DIAGNOSIS — M2578 Osteophyte, vertebrae: Secondary | ICD-10-CM | POA: Diagnosis not present

## 2015-08-29 DIAGNOSIS — M4692 Unspecified inflammatory spondylopathy, cervical region: Secondary | ICD-10-CM | POA: Diagnosis not present

## 2015-08-29 DIAGNOSIS — M542 Cervicalgia: Secondary | ICD-10-CM | POA: Diagnosis not present

## 2015-08-29 DIAGNOSIS — M25561 Pain in right knee: Secondary | ICD-10-CM | POA: Diagnosis not present

## 2015-08-29 DIAGNOSIS — Z1389 Encounter for screening for other disorder: Secondary | ICD-10-CM | POA: Diagnosis not present

## 2015-08-29 DIAGNOSIS — G4762 Sleep related leg cramps: Secondary | ICD-10-CM | POA: Diagnosis not present

## 2015-08-29 DIAGNOSIS — E669 Obesity, unspecified: Secondary | ICD-10-CM | POA: Diagnosis not present

## 2015-08-29 DIAGNOSIS — E559 Vitamin D deficiency, unspecified: Secondary | ICD-10-CM | POA: Diagnosis not present

## 2015-08-29 DIAGNOSIS — H8309 Labyrinthitis, unspecified ear: Secondary | ICD-10-CM | POA: Diagnosis not present

## 2015-08-29 DIAGNOSIS — Z6836 Body mass index (BMI) 36.0-36.9, adult: Secondary | ICD-10-CM | POA: Diagnosis not present

## 2015-08-29 DIAGNOSIS — R42 Dizziness and giddiness: Secondary | ICD-10-CM | POA: Diagnosis not present

## 2016-01-19 DIAGNOSIS — J069 Acute upper respiratory infection, unspecified: Secondary | ICD-10-CM | POA: Diagnosis not present

## 2016-04-13 DIAGNOSIS — L570 Actinic keratosis: Secondary | ICD-10-CM | POA: Diagnosis not present

## 2016-04-13 DIAGNOSIS — L719 Rosacea, unspecified: Secondary | ICD-10-CM | POA: Diagnosis not present

## 2016-04-13 DIAGNOSIS — Z85828 Personal history of other malignant neoplasm of skin: Secondary | ICD-10-CM | POA: Diagnosis not present

## 2016-05-12 DIAGNOSIS — J0100 Acute maxillary sinusitis, unspecified: Secondary | ICD-10-CM | POA: Diagnosis not present

## 2016-05-12 DIAGNOSIS — J209 Acute bronchitis, unspecified: Secondary | ICD-10-CM | POA: Diagnosis not present

## 2016-05-19 DIAGNOSIS — E669 Obesity, unspecified: Secondary | ICD-10-CM | POA: Diagnosis not present

## 2016-05-19 DIAGNOSIS — J209 Acute bronchitis, unspecified: Secondary | ICD-10-CM | POA: Diagnosis not present

## 2016-05-19 DIAGNOSIS — J309 Allergic rhinitis, unspecified: Secondary | ICD-10-CM | POA: Diagnosis not present

## 2016-05-19 DIAGNOSIS — Z6836 Body mass index (BMI) 36.0-36.9, adult: Secondary | ICD-10-CM | POA: Diagnosis not present

## 2016-07-01 DIAGNOSIS — Z6838 Body mass index (BMI) 38.0-38.9, adult: Secondary | ICD-10-CM | POA: Diagnosis not present

## 2016-07-01 DIAGNOSIS — Z0000 Encounter for general adult medical examination without abnormal findings: Secondary | ICD-10-CM | POA: Diagnosis not present

## 2016-07-01 DIAGNOSIS — Z1389 Encounter for screening for other disorder: Secondary | ICD-10-CM | POA: Diagnosis not present

## 2016-07-01 DIAGNOSIS — Z9181 History of falling: Secondary | ICD-10-CM | POA: Diagnosis not present

## 2016-07-01 DIAGNOSIS — E785 Hyperlipidemia, unspecified: Secondary | ICD-10-CM | POA: Diagnosis not present

## 2016-07-13 DIAGNOSIS — M1711 Unilateral primary osteoarthritis, right knee: Secondary | ICD-10-CM | POA: Diagnosis not present

## 2016-07-13 DIAGNOSIS — M170 Bilateral primary osteoarthritis of knee: Secondary | ICD-10-CM | POA: Diagnosis not present

## 2016-07-13 DIAGNOSIS — M1712 Unilateral primary osteoarthritis, left knee: Secondary | ICD-10-CM | POA: Diagnosis not present

## 2016-07-24 DIAGNOSIS — L2089 Other atopic dermatitis: Secondary | ICD-10-CM | POA: Diagnosis not present

## 2016-08-27 ENCOUNTER — Encounter (HOSPITAL_COMMUNITY): Payer: Self-pay | Admitting: Emergency Medicine

## 2016-08-27 ENCOUNTER — Emergency Department (HOSPITAL_COMMUNITY): Payer: Medicare Other

## 2016-08-27 ENCOUNTER — Emergency Department (HOSPITAL_COMMUNITY)
Admission: EM | Admit: 2016-08-27 | Discharge: 2016-08-28 | Disposition: A | Discharge status: Home / Self Care | Payer: Medicare Other | Attending: Emergency Medicine | Admitting: Emergency Medicine

## 2016-08-27 DIAGNOSIS — Z880 Allergy status to penicillin: Secondary | ICD-10-CM | POA: Insufficient documentation

## 2016-08-27 DIAGNOSIS — M25522 Pain in left elbow: Secondary | ICD-10-CM | POA: Diagnosis not present

## 2016-08-27 DIAGNOSIS — M25552 Pain in left hip: Secondary | ICD-10-CM | POA: Diagnosis not present

## 2016-08-27 DIAGNOSIS — M25551 Pain in right hip: Secondary | ICD-10-CM | POA: Diagnosis not present

## 2016-08-27 DIAGNOSIS — Y93G9 Activity, other involving cooking and grilling: Secondary | ICD-10-CM | POA: Insufficient documentation

## 2016-08-27 DIAGNOSIS — M545 Low back pain: Secondary | ICD-10-CM | POA: Diagnosis not present

## 2016-08-27 DIAGNOSIS — W07XXXA Fall from chair, initial encounter: Secondary | ICD-10-CM | POA: Diagnosis not present

## 2016-08-27 DIAGNOSIS — Y999 Unspecified external cause status: Secondary | ICD-10-CM | POA: Diagnosis not present

## 2016-08-27 DIAGNOSIS — S59902A Unspecified injury of left elbow, initial encounter: Secondary | ICD-10-CM | POA: Diagnosis not present

## 2016-08-27 DIAGNOSIS — Y929 Unspecified place or not applicable: Secondary | ICD-10-CM | POA: Diagnosis not present

## 2016-08-27 DIAGNOSIS — W19XXXA Unspecified fall, initial encounter: Secondary | ICD-10-CM

## 2016-08-27 DIAGNOSIS — S59911A Unspecified injury of right forearm, initial encounter: Secondary | ICD-10-CM | POA: Diagnosis present

## 2016-08-27 DIAGNOSIS — M25511 Pain in right shoulder: Secondary | ICD-10-CM | POA: Diagnosis not present

## 2016-08-27 DIAGNOSIS — S59291A Other physeal fracture of lower end of radius, right arm, initial encounter for closed fracture: Secondary | ICD-10-CM | POA: Diagnosis not present

## 2016-08-27 DIAGNOSIS — S52501A Unspecified fracture of the lower end of right radius, initial encounter for closed fracture: Secondary | ICD-10-CM | POA: Insufficient documentation

## 2016-08-27 HISTORY — DX: Carpal tunnel syndrome, unspecified upper limb: G56.00

## 2016-08-27 MED ORDER — OXYCODONE-ACETAMINOPHEN 5-325 MG PO TABS: 1.0000 | ORAL_TABLET | ORAL | 0 refills | Status: DC | PRN

## 2016-08-27 MED ORDER — OXYCODONE-ACETAMINOPHEN 5-325 MG PO TABS
1.0000 | ORAL_TABLET | Freq: Once | ORAL | Status: AC
  Administered 2016-08-27: 1 via ORAL
  Filled 2016-08-27: qty 1

## 2016-08-27 NOTE — ED Triage Notes (Signed)
Pt states she was standing on a step stool to reach something and fell back. A&O x 4, swelling to right wrist, denies blood thinner, denies LOC.

## 2016-08-27 NOTE — ED Provider Notes (Signed)
[facility] DEPT Provider Note   CSN: [id number] Arrival date & time: 08/27/16  1935     History   Chief Complaint Chief Complaint  Patient presents with  . Fall  . Arm Injury    HPI Janet Diaz is a 71 y.o. female who presents with right wrist pain after a fall. PMH significant for arthritis. She states she was up on a 2 step stool trying to reach some oregano when she fell backwards on to her back. She thinks she used her wrist to break her fall but cannot recall because it happened so fast. She denies head injury, neck pain, chest pain, SOB, abdominal pain. She does endorse low back pain, bilateral hip pain, R shoulder and R elbow pain in addition to her wrist which is the most painful and swollen. She has been able to walk since the accident.   HPI  Past Medical History:  Diagnosis Date  . Carpal tunnel syndrome     Patient Active Problem List   Diagnosis Date Noted  . DIVERTICULOSIS, COLON 04/23/2008  . OSTEOARTHRITIS 04/23/2008  . LUMBAR RADICULOPATHY, RIGHT 04/23/2008  . ANXIETY STATE, UNSPECIFIED 11/09/2007  . SKIN CANCER, HX OF 11/09/2007    Past Surgical History:  Procedure Laterality Date  . ABDOMINAL HYSTERECTOMY    . KNEE SURGERY      OB History    No data available       Home Medications    Prior to Admission medications   Not on File    Family History No family history on file.  Social History Social History  Substance Use Topics  . Smoking status: Never Smoker  . Smokeless tobacco: Never Used  . Alcohol use No     Allergies   Penicillins   Review of Systems Review of Systems  Respiratory: Negative for shortness of breath.   Cardiovascular: Negative for chest pain.  Gastrointestinal: Negative for abdominal pain.  Musculoskeletal: Positive for arthralgias, back pain, joint swelling and myalgias. Negative for gait problem and neck pain.  Skin: Positive for wound.  Neurological: Negative for syncope, light-headedness and  headaches.  Hematological: Does not bruise/bleed easily.     Physical Exam Updated Vital Signs BP (!) 160/76 (BP Location: Left Arm)   Pulse 62   Temp 98.3 F (36.8 C) (Oral)   Resp 18   Ht 5' (1.524 m)   SpO2 97%   Physical Exam  Constitutional: She is oriented to person, place, and time. She appears well-developed and well-nourished. No distress.  HENT:  Head: Normocephalic and atraumatic.  Eyes: Pupils are equal, round, and reactive to light. Conjunctivae are normal. Right eye exhibits no discharge. Left eye exhibits no discharge. No scleral icterus.  Neck: Normal range of motion.  Cardiovascular: Normal rate.   Pulmonary/Chest: Effort normal. No respiratory distress.  Abdominal: She exhibits no distension.  Musculoskeletal:  Right shoulder and elbow: No obvious swelling, deformity, or warmth. Skin tears over elbow. Tenderness to palpation of mid-arm . Decreased ROM of shoulder and elbow.  Right wrist: Obvious swelling and deformity. Tenderness to palpation of lateral aspect of wrist. Able to move all fingers. Limited by pain. N/V intact.  Back: Midline lumbar tenderness and flank tenderness  Hips and knees: No obvious swelling, deformity, or warmth. Pain with ROM of hips. Ambulatory     Neurological: She is alert and oriented to person, place, and time.  Skin: Skin is warm and dry.  Psychiatric: She has a normal mood and affect. Her behavior  is normal.  Nursing note and vitals reviewed.    ED Treatments / Results  Labs (all labs ordered are listed, but only abnormal results are displayed) Labs Reviewed - No data to display  EKG  EKG Interpretation None       Radiology Dg Lumbar Spine Complete  Result Date: 08/27/2016 CLINICAL DATA:  Low back pain after a fall EXAM: LUMBAR SPINE - COMPLETE 4+ VIEW COMPARISON:  None. FINDINGS: Lumbar alignment within normal limits. Vertebral body heights are normal. Moderate degenerative changes at L1-L2 and L2-L3.  IMPRESSION: Degenerative changes.  No acute osseous abnormality. Electronically Signed   By: Donavan Foil M.D.   On: 08/27/2016 22:41   Dg Shoulder Right  Result Date: 08/27/2016 CLINICAL DATA:  Right shoulder pain after a fall EXAM: RIGHT SHOULDER - 2+ VIEW COMPARISON:  None. FINDINGS: There is no evidence of fracture or dislocation. There is no evidence of arthropathy or other focal bone abnormality. Soft tissues are unremarkable. IMPRESSION: Negative. Electronically Signed   By: Donavan Foil M.D.   On: 08/27/2016 22:42   Dg Elbow Complete Right  Result Date: 08/27/2016 CLINICAL DATA:  Elbow pain after a fall EXAM: RIGHT ELBOW - COMPLETE 3+ VIEW COMPARISON:  None. FINDINGS: There is no evidence of fracture, dislocation, or joint effusion. There is no evidence of arthropathy or other focal bone abnormality. Soft tissues are unremarkable. IMPRESSION: Negative. Electronically Signed   By: Donavan Foil M.D.   On: 08/27/2016 22:43   Dg Wrist Complete Right  Result Date: 08/27/2016 CLINICAL DATA:  71 year old female with history of trauma from a fall onto the right wrist earlier today. Pain and swelling. EXAM: RIGHT WRIST - COMPLETE 3+ VIEW COMPARISON:  No priors. FINDINGS: Four views of the right wrist demonstrate a mildly impacted fracture of the distal radial metaphysis. No definite intra-articular extension is identified. No additional acute displaced fracture, subluxation or dislocation is noted. Specifically, the ulnar styloid appears intact. Extensive soft tissue swelling around the wrist joint. Degenerative changes of osteoarthritis are noted in the intercarpal joints. IMPRESSION: 1. Acute mildly impacted distal radial metaphyseal fracture with overlying soft tissue swelling. Electronically Signed   By: Vinnie Langton M.D.   On: 08/27/2016 20:53   Dg Hips Bilat W Or Wo Pelvis 3-4 Views  Result Date: 08/27/2016 CLINICAL DATA:  Hip pain after a fall EXAM: DG HIP (WITH OR WITHOUT PELVIS) 3-4V  BILAT COMPARISON:  None. FINDINGS: Mild SI joint arthritis. Calcified pelvic phleboliths. Pubic symphysis is intact. The rami are within normal limits. IMPRESSION: Negative. Electronically Signed   By: Donavan Foil M.D.   On: 08/27/2016 22:44    Procedures Procedures (including critical care time)  SPLINT APPLICATION Date/Time: 30:86 AM Authorized by: Recardo Evangelist Consent: Verbal consent obtained. Risks and benefits: risks, benefits and alternatives were discussed Consent given by: patient Splint applied by: orthopedic technician Location details: right upper extremity Splint type: Sugar tong Supplies used: fiberglass, ACE wrap Post-procedure: The splinted body part was neurovascularly unchanged following the procedure. Patient tolerance: Patient tolerated the procedure well with no immediate complications.     Medications Ordered in ED Medications  oxyCODONE-acetaminophen (PERCOCET/ROXICET) 5-325 MG per tablet 1 tablet (1 tablet Oral Given 08/27/16 2131)     Initial Impression / Assessment and Plan / ED Course  I have reviewed the triage vital signs and the nursing notes.  Pertinent labs & imaging results that were available during my care of the patient were reviewed by me and considered in my  medical decision making (see chart for details).  71 year old female with closed comminuted distal radius fracture. Xrays of lumbar spine, hips, shoulder, elbow are negative. She was given Percocet in the ED with significant relief. Sugar tong splint was applied. Shared visit with Dr. Zenia Resides. She was advised to ice, elevate, take pain medicine as needed and follow up with Dr. Burney Gauze who she is already established with. Return precautions given.  Final Clinical Impressions(s) / ED Diagnoses   Final diagnoses:  Closed fracture of distal end of right radius, unspecified fracture morphology, initial encounter  Fall, initial encounter    New Prescriptions New Prescriptions   No  medications on file     Recardo Evangelist, PA-C 08/28/16 5217

## 2016-08-27 NOTE — Discharge Instructions (Signed)
Please ice 20 minutes a day and try to keep elevated to reduce swelling Take pain medicine as needed Follow up with Dr. Burney Gauze Return for worsening symptoms

## 2016-08-27 NOTE — ED Provider Notes (Signed)
Medical screening examination/treatment/procedure(s) were conducted as a shared visit with non-physician practitioner(s) and myself.  I personally evaluated the patient during the encounter.   EKG Interpretation None     71 year old female here after having mechanical fall just prior to arrival. Has evidence of distal radius fracture. Splint to be placed by orthopedic tech in referral to hand surgery to be given   Lacretia Leigh, MD 08/27/16 2245

## 2016-08-28 DIAGNOSIS — S52501A Unspecified fracture of the lower end of right radius, initial encounter for closed fracture: Secondary | ICD-10-CM | POA: Diagnosis not present

## 2016-08-28 NOTE — Progress Notes (Signed)
Orthopedic Tech Progress Note Patient Details:  Janet Diaz [birth year] [id number]  Ortho Devices Type of Ortho Device: Sugartong splint Ortho Device/Splint Location: rue Ortho Device/Splint Interventions: Ordered, Application   Karolee Stamps 08/28/2016, 12:29 AM

## 2016-09-01 DIAGNOSIS — S52571A Other intraarticular fracture of lower end of right radius, initial encounter for closed fracture: Secondary | ICD-10-CM | POA: Diagnosis not present

## 2016-09-01 DIAGNOSIS — Z9889 Other specified postprocedural states: Secondary | ICD-10-CM | POA: Diagnosis not present

## 2016-09-10 DIAGNOSIS — S52501A Unspecified fracture of the lower end of right radius, initial encounter for closed fracture: Secondary | ICD-10-CM | POA: Insufficient documentation

## 2016-09-10 DIAGNOSIS — S52571A Other intraarticular fracture of lower end of right radius, initial encounter for closed fracture: Secondary | ICD-10-CM | POA: Diagnosis not present

## 2016-09-11 DIAGNOSIS — S52571A Other intraarticular fracture of lower end of right radius, initial encounter for closed fracture: Secondary | ICD-10-CM | POA: Diagnosis not present

## 2016-09-11 DIAGNOSIS — Y999 Unspecified external cause status: Secondary | ICD-10-CM | POA: Diagnosis not present

## 2016-09-11 DIAGNOSIS — G8918 Other acute postprocedural pain: Secondary | ICD-10-CM | POA: Diagnosis not present

## 2016-09-15 DIAGNOSIS — M25431 Effusion, right wrist: Secondary | ICD-10-CM | POA: Diagnosis not present

## 2016-09-15 DIAGNOSIS — M25639 Stiffness of unspecified wrist, not elsewhere classified: Secondary | ICD-10-CM | POA: Diagnosis not present

## 2016-09-15 DIAGNOSIS — S52571D Other intraarticular fracture of lower end of right radius, subsequent encounter for closed fracture with routine healing: Secondary | ICD-10-CM | POA: Diagnosis not present

## 2016-09-15 DIAGNOSIS — M25531 Pain in right wrist: Secondary | ICD-10-CM | POA: Diagnosis not present

## 2016-09-15 DIAGNOSIS — S52571A Other intraarticular fracture of lower end of right radius, initial encounter for closed fracture: Secondary | ICD-10-CM | POA: Diagnosis not present

## 2016-09-16 DIAGNOSIS — M25531 Pain in right wrist: Secondary | ICD-10-CM

## 2016-09-16 DIAGNOSIS — M25431 Effusion, right wrist: Secondary | ICD-10-CM | POA: Insufficient documentation

## 2016-09-16 DIAGNOSIS — M25639 Stiffness of unspecified wrist, not elsewhere classified: Secondary | ICD-10-CM | POA: Insufficient documentation

## 2016-09-24 DIAGNOSIS — M25531 Pain in right wrist: Secondary | ICD-10-CM | POA: Diagnosis not present

## 2016-09-24 DIAGNOSIS — M25639 Stiffness of unspecified wrist, not elsewhere classified: Secondary | ICD-10-CM | POA: Diagnosis not present

## 2016-10-08 DIAGNOSIS — S52571A Other intraarticular fracture of lower end of right radius, initial encounter for closed fracture: Secondary | ICD-10-CM | POA: Diagnosis not present

## 2016-10-08 DIAGNOSIS — R531 Weakness: Secondary | ICD-10-CM | POA: Diagnosis not present

## 2016-10-08 DIAGNOSIS — M25639 Stiffness of unspecified wrist, not elsewhere classified: Secondary | ICD-10-CM | POA: Diagnosis not present

## 2016-10-08 DIAGNOSIS — S52571D Other intraarticular fracture of lower end of right radius, subsequent encounter for closed fracture with routine healing: Secondary | ICD-10-CM | POA: Diagnosis not present

## 2016-10-14 DIAGNOSIS — M25631 Stiffness of right wrist, not elsewhere classified: Secondary | ICD-10-CM | POA: Diagnosis not present

## 2016-10-27 DIAGNOSIS — S52571D Other intraarticular fracture of lower end of right radius, subsequent encounter for closed fracture with routine healing: Secondary | ICD-10-CM | POA: Diagnosis not present

## 2016-11-16 DIAGNOSIS — Z6838 Body mass index (BMI) 38.0-38.9, adult: Secondary | ICD-10-CM | POA: Diagnosis not present

## 2016-11-16 DIAGNOSIS — E669 Obesity, unspecified: Secondary | ICD-10-CM | POA: Diagnosis not present

## 2016-11-16 DIAGNOSIS — R42 Dizziness and giddiness: Secondary | ICD-10-CM | POA: Diagnosis not present

## 2016-11-16 DIAGNOSIS — F418 Other specified anxiety disorders: Secondary | ICD-10-CM | POA: Diagnosis not present

## 2016-12-01 DIAGNOSIS — S52571D Other intraarticular fracture of lower end of right radius, subsequent encounter for closed fracture with routine healing: Secondary | ICD-10-CM | POA: Diagnosis not present

## 2016-12-24 DIAGNOSIS — M1712 Unilateral primary osteoarthritis, left knee: Secondary | ICD-10-CM | POA: Diagnosis not present

## 2016-12-24 DIAGNOSIS — M1711 Unilateral primary osteoarthritis, right knee: Secondary | ICD-10-CM | POA: Diagnosis not present

## 2016-12-24 DIAGNOSIS — M170 Bilateral primary osteoarthritis of knee: Secondary | ICD-10-CM | POA: Diagnosis not present

## 2017-01-14 DIAGNOSIS — Z85828 Personal history of other malignant neoplasm of skin: Secondary | ICD-10-CM | POA: Diagnosis not present

## 2017-01-14 DIAGNOSIS — L578 Other skin changes due to chronic exposure to nonionizing radiation: Secondary | ICD-10-CM | POA: Diagnosis not present

## 2017-04-07 DIAGNOSIS — M179 Osteoarthritis of knee, unspecified: Secondary | ICD-10-CM | POA: Insufficient documentation

## 2017-04-07 DIAGNOSIS — M1710 Unilateral primary osteoarthritis, unspecified knee: Secondary | ICD-10-CM | POA: Insufficient documentation

## 2017-04-09 DIAGNOSIS — M170 Bilateral primary osteoarthritis of knee: Secondary | ICD-10-CM | POA: Diagnosis not present

## 2017-04-12 DIAGNOSIS — F418 Other specified anxiety disorders: Secondary | ICD-10-CM | POA: Diagnosis not present

## 2017-04-12 DIAGNOSIS — Z6838 Body mass index (BMI) 38.0-38.9, adult: Secondary | ICD-10-CM | POA: Diagnosis not present

## 2017-04-12 DIAGNOSIS — R079 Chest pain, unspecified: Secondary | ICD-10-CM | POA: Diagnosis not present

## 2017-04-12 DIAGNOSIS — H8303 Labyrinthitis, bilateral: Secondary | ICD-10-CM | POA: Diagnosis not present

## 2017-04-12 DIAGNOSIS — M1710 Unilateral primary osteoarthritis, unspecified knee: Secondary | ICD-10-CM | POA: Diagnosis not present

## 2017-04-12 DIAGNOSIS — B372 Candidiasis of skin and nail: Secondary | ICD-10-CM | POA: Diagnosis not present

## 2017-04-12 DIAGNOSIS — E669 Obesity, unspecified: Secondary | ICD-10-CM | POA: Diagnosis not present

## 2017-04-12 DIAGNOSIS — J309 Allergic rhinitis, unspecified: Secondary | ICD-10-CM | POA: Diagnosis not present

## 2017-04-27 ENCOUNTER — Encounter: Payer: Self-pay | Admitting: Cardiology

## 2017-04-27 DIAGNOSIS — Z6838 Body mass index (BMI) 38.0-38.9, adult: Secondary | ICD-10-CM | POA: Diagnosis not present

## 2017-04-27 DIAGNOSIS — J309 Allergic rhinitis, unspecified: Secondary | ICD-10-CM | POA: Diagnosis not present

## 2017-04-27 DIAGNOSIS — F418 Other specified anxiety disorders: Secondary | ICD-10-CM | POA: Diagnosis not present

## 2017-04-27 DIAGNOSIS — M1710 Unilateral primary osteoarthritis, unspecified knee: Secondary | ICD-10-CM | POA: Diagnosis not present

## 2017-04-27 DIAGNOSIS — E559 Vitamin D deficiency, unspecified: Secondary | ICD-10-CM | POA: Diagnosis not present

## 2017-04-27 DIAGNOSIS — E785 Hyperlipidemia, unspecified: Secondary | ICD-10-CM | POA: Diagnosis not present

## 2017-04-27 DIAGNOSIS — R600 Localized edema: Secondary | ICD-10-CM | POA: Diagnosis not present

## 2017-04-27 DIAGNOSIS — Z01818 Encounter for other preprocedural examination: Secondary | ICD-10-CM | POA: Diagnosis not present

## 2017-05-05 ENCOUNTER — Encounter: Payer: Self-pay | Admitting: Cardiology

## 2017-05-05 DIAGNOSIS — R600 Localized edema: Secondary | ICD-10-CM | POA: Insufficient documentation

## 2017-05-10 ENCOUNTER — Ambulatory Visit: Payer: Medicare Other | Admitting: Cardiology

## 2017-05-24 ENCOUNTER — Encounter: Payer: Self-pay | Admitting: Cardiology

## 2017-05-24 DIAGNOSIS — E669 Obesity, unspecified: Secondary | ICD-10-CM | POA: Insufficient documentation

## 2017-05-28 DIAGNOSIS — M25561 Pain in right knee: Secondary | ICD-10-CM | POA: Diagnosis not present

## 2017-06-01 ENCOUNTER — Encounter: Payer: Self-pay | Admitting: Cardiology

## 2017-06-01 ENCOUNTER — Ambulatory Visit (INDEPENDENT_AMBULATORY_CARE_PROVIDER_SITE_OTHER): Payer: Medicare Other | Admitting: Cardiology

## 2017-06-01 VITALS — BP 128/60 | HR 63 | Ht 60.0 in | Wt 194.0 lb

## 2017-06-01 DIAGNOSIS — E669 Obesity, unspecified: Secondary | ICD-10-CM | POA: Diagnosis not present

## 2017-06-01 DIAGNOSIS — Z01810 Encounter for preprocedural cardiovascular examination: Secondary | ICD-10-CM

## 2017-06-01 NOTE — Patient Instructions (Addendum)
Medication Instructions:  Your physician recommends that you continue on your current medications as directed. Please refer to the Current Medication list given to you today.  Labwork: None  Testing/Procedures: Your physician has requested that you have an echocardiogram. Echocardiography is a painless test that uses sound waves to create images of your heart. It provides your doctor with information about the size and shape of your heart and how well your heart's chambers and valves are working. This procedure takes approximately one hour. There are no restrictions for this procedure.  Your physician has requested that you have a lexiscan myoview. For further information please visit [email or web address]. Please follow instruction sheet, as given.  Follow-Up: Your physician recommends that you schedule a follow-up appointment in: July, 2019  Any Other Special Instructions Will Be Listed Below (If Applicable).     If you need a refill on your cardiac medications before your next appointment, please call your pharmacy.   [place], RN, BSN  Echocardiogram An echocardiogram, or echocardiography, uses sound waves (ultrasound) to produce an image of your heart. The echocardiogram is simple, painless, obtained within a short period of time, and offers valuable information to your health care provider. The images from an echocardiogram can provide information such as:  Evidence of coronary artery disease (CAD).  Heart size.  Heart muscle function.  Heart valve function.  Aneurysm detection.  Evidence of a past heart attack.  Fluid buildup around the heart.  Heart muscle thickening.  Assess heart valve function.  Tell a health care provider about:  Any allergies you have.  All medicines you are taking, including vitamins, herbs, eye drops, creams, and over-the-counter medicines.  Any problems you or family members have had with anesthetic medicines.  Any  blood disorders you have.  Any surgeries you have had.  Any medical conditions you have.  Whether you are pregnant or may be pregnant. What happens before the procedure? No special preparation is needed. Eat and drink normally. What happens during the procedure?  In order to produce an image of your heart, gel will be applied to your chest and a wand-like tool (transducer) will be moved over your chest. The gel will help transmit the sound waves from the transducer. The sound waves will harmlessly bounce off your heart to allow the heart images to be captured in real-time motion. These images will then be recorded.  You may need an IV to receive a medicine that improves the quality of the pictures. What happens after the procedure? You may return to your normal schedule including diet, activities, and medicines, unless your health care provider tells you otherwise. This information is not intended to replace advice given to you by your health care provider. Make sure you discuss any questions you have with your health care provider. Document Released: 12/27/1999 Document Revised: 08/17/2015 Document Reviewed: 09/05/2012 Elsevier Interactive Patient Education  2017 [employer].

## 2017-06-01 NOTE — Progress Notes (Signed)
Cardiology Office Note:    Date:  7/67/2094   ID:  Janet Diaz, DOB [birth year], MRN [id number]  PCP:  Patient, No Pcp Per  Cardiologist:  Jenean Lindau, MD   Referring MD: Nicholos Johns, MD    ASSESSMENT:    1. Pre-operative cardiovascular examination   2. Obesity (BMI 30-39.9)    PLAN:    In order of problems listed above:  1. Primary prevention stressed with the patient.  Importance of compliance with diet and medications stressed and she vocalized understanding.  Her blood pressure is stable. 2. She has multiple risk factors for coronary artery disease and leads a sedentary lifestyle.  She has history of dyslipidemia.  In view of this she will undergo Lexiscan sestamibi.  If this is negative and she is not at high risk for coronary events during the aforementioned surgery.  Meticulous hemodynamic monitoring will further reduce the risk of coronary events. 3. Echocardiogram will be done to assess murmur heard on auscultation. 4. Patient will be seen in follow-up appointment in 6 months or earlier if the patient has any concerns    Medication Adjustments/Labs and Tests Ordered: Current medicines are reviewed at length with the patient today.  Concerns regarding medicines are outlined above.  No orders of the defined types were placed in this encounter.  No orders of the defined types were placed in this encounter.    History of Present Illness:    Janet Diaz is a 72 y.o. female who is being seen today for the evaluation of preop cardiovascular assessment  at the request of Nicholos Johns, MD.  Medical problems.  She is planning to undergo knee replacement surgery.  Her EKG is abnormal and has a left bundle branch block.  Because of the aforementioned problem she leads a sedentary lifestyle.  She denies any chest pain orthopnea or PND.  At the time of my evaluation, the patient is alert awake oriented and in no distress.  Past Medical History:  Diagnosis Date  . Acute  bronchitis   . Allergic rhinosinusitis   . Anxiety and depression   . Candida infection of flexural skin   . Carpal tunnel syndrome   . Dyslipidemia   . GERD (gastroesophageal reflux disease)   . Laryngitis   . OAB (overactive bladder)   . Osteoarthritis of right knee   . Pedal edema   . Vitamin B 12 deficiency   . Vitamin D deficiency     Past Surgical History:  Procedure Laterality Date  . ABDOMINAL HYSTERECTOMY  1975  . CARPAL TUNNEL RELEASE  2000  . CATARACT EXTRACTION  2014  . FINGER SURGERY  2016  . KNEE SURGERY    . PARTIAL HYSTERECTOMY  1975   has ovaries  . torn cartilage    . WRIST SURGERY Left 2014    Current Medications: Current Meds  Medication Sig  . citalopram (CELEXA) 20 MG tablet Take 20 mg by mouth daily.  . ergocalciferol (VITAMIN D2) 50000 units capsule Take 50,000 Units by mouth once a week.  . fluticasone (FLONASE) 50 MCG/ACT nasal spray Place 1 spray into both nostrils daily.  . furosemide (LASIX) 20 MG tablet Take 20 mg by mouth as needed.  . loratadine (CLARITIN) 10 MG tablet Take 10 mg by mouth daily.  . meclizine (ANTIVERT) 12.5 MG tablet Take 2 tablets by mouth as needed.  . montelukast (SINGULAIR) 10 MG tablet Take 10 mg by mouth at bedtime.  . pantoprazole (PROTONIX) 40 MG  tablet Take 40 mg by mouth daily.  . potassium chloride (MICRO-K) 10 MEQ CR capsule Take 10 mEq by mouth 2 (two) times daily.  . [DISCONTINUED] aspirin EC 81 MG tablet Take 81 mg by mouth daily.  . [DISCONTINUED] fluticasone furoate-vilanterol (BREO ELLIPTA) 200-25 MCG/INH AEPB Inhale 2 puffs into the lungs daily.     Allergies:   Omeprazole and Penicillins   Social History   Socioeconomic History  . Marital status: Married    Spouse name: Not on file  . Number of children: Not on file  . Years of education: Not on file  . Highest education level: Not on file  Occupational History  . Not on file  Social Needs  . Financial resource strain: Not on file  . Food  insecurity:    Worry: Not on file    Inability: Not on file  . Transportation needs:    Medical: Not on file    Non-medical: Not on file  Tobacco Use  . Smoking status: Never Smoker  . Smokeless tobacco: Never Used  Substance and Sexual Activity  . Alcohol use: No  . Drug use: No  . Sexual activity: Not on file  Lifestyle  . Physical activity:    Days per week: Not on file    Minutes per session: Not on file  . Stress: Not on file  Relationships  . Social connections:    Talks on phone: Not on file    Gets together: Not on file    Attends religious service: Not on file    Active member of club or organization: Not on file    Attends meetings of clubs or organizations: Not on file    Relationship status: Not on file  Other Topics Concern  . Not on file  Social History Narrative  . Not on file     Family History: The patient's family history includes Heart attack in her mother; Heart disease in her mother; Hypertension in her mother; Prostate cancer in her paternal grandfather; Stroke in her mother.  ROS:   Please see the history of present illness.    All other systems reviewed and are negative.  EKGs/Labs/Other Studies Reviewed:    The following studies were reviewed today: I reviewed EKG and this revealed sinus rhythm with left bundle branch block.   Recent Labs: No results found for requested labs within last 8760 hours.  Recent Lipid Panel    Component Value Date/Time   CHOL 233 (H) 04/23/2008 1154   TRIG 81.0 04/23/2008 1154   HDL 57.80 04/23/2008 1154   CHOLHDL 4 04/23/2008 1154   VLDL 16.2 04/23/2008 1154   LDLDIRECT 158.8 04/23/2008 1154    Physical Exam:    VS:  BP 128/60 (BP Location: Left Arm, Patient Position: Sitting, Cuff Size: Normal)   Pulse 63   Ht 5' (1.524 m)   Wt 194 lb (88 kg)   SpO2 98%   BMI 37.89 kg/m     Wt Readings from Last 3 Encounters:  06/01/17 194 lb (88 kg)     GEN: Patient is in no acute distress HEENT:  Normal NECK: No JVD; No carotid bruits LYMPHATICS: No lymphadenopathy CARDIAC: S1 S2 regular, 2/6 systolic murmur at the apex. RESPIRATORY:  Clear to auscultation without rales, wheezing or rhonchi  ABDOMEN: Soft, non-tender, non-distended MUSCULOSKELETAL:  No edema; No deformity  SKIN: Warm and dry NEUROLOGIC:  Alert and oriented x 3 PSYCHIATRIC:  Normal affect    Signed, Reita Cliche Neeti Knudtson,  MD  06/01/2017 4:13 PM    [facility]

## 2017-06-01 NOTE — Addendum Note (Signed)
Addended by: Mattie Marlin on: 06/01/2017 04:24 PM   Modules accepted: Orders

## 2017-06-02 NOTE — Addendum Note (Signed)
Addended by: Tarri Glenn on: 06/02/2017 10:21 AM   Modules accepted: Orders

## 2017-06-09 ENCOUNTER — Other Ambulatory Visit: Payer: Self-pay

## 2017-06-09 ENCOUNTER — Telehealth: Payer: Self-pay

## 2017-06-09 DIAGNOSIS — Z01810 Encounter for preprocedural cardiovascular examination: Secondary | ICD-10-CM

## 2017-06-09 DIAGNOSIS — I350 Nonrheumatic aortic (valve) stenosis: Secondary | ICD-10-CM | POA: Diagnosis not present

## 2017-06-09 DIAGNOSIS — Z01818 Encounter for other preprocedural examination: Secondary | ICD-10-CM | POA: Diagnosis not present

## 2017-06-09 NOTE — Telephone Encounter (Signed)
Informed patient of both cardiac testing results performed at [facility].

## 2017-06-11 DIAGNOSIS — M1711 Unilateral primary osteoarthritis, right knee: Secondary | ICD-10-CM | POA: Diagnosis not present

## 2017-06-25 NOTE — H&P (Signed)
TOTAL KNEE ADMISSION H&P  Patient is being admitted for right total knee arthroplasty.  Subjective:  Chief Complaint:right knee pain.  HPI: Janet Diaz, 72 y.o. female, has a history of pain and functional disability in the right knee due to arthritis and has failed non-surgical conservative treatments for greater than 12 weeks to includeNSAID's and/or analgesics, corticosteriod injections, flexibility and strengthening excercises and activity modification.  Onset of symptoms was gradual, starting 3 years ago with gradually worsening course since that time. The patient noted no past surgery on the right knee(s).  Patient currently rates pain in the right knee(s) at 8 out of 10 with activity. Patient has night pain, worsening of pain with activity and weight bearing, pain that interferes with activities of daily living, pain with passive range of motion, crepitus and joint swelling.  Patient has evidence of subchondral cysts, periarticular osteophytes and joint space narrowing by imaging studies. There is no active infection.  Patient Active Problem List   Diagnosis Date Noted  . Pre-operative cardiovascular examination 06/01/2017  . Obesity (BMI 30-39.9) 05/24/2017  . Pedal edema 05/05/2017  . Osteoarthritis of knee 04/07/2017  . Decreased ROM of wrist 09/16/2016  . Pain and swelling of right wrist 09/16/2016  . Closed fracture of right distal radius 09/10/2016  . DIVERTICULOSIS, COLON 04/23/2008  . 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 Acute bronchitis   . Allergic rhinosinusitis   . Anxiety and depression   . Candida infection of flexural skin   . Carpal tunnel syndrome   . Dyslipidemia   . GERD (gastroesophageal reflux disease)   . Laryngitis   . OAB (overactive bladder)   . Osteoarthritis of right knee   . Pedal edema   . Vitamin B 12 deficiency   . Vitamin D deficiency     Past Surgical History:  Procedure Laterality Date  .  ABDOMINAL HYSTERECTOMY  1975  . CARPAL TUNNEL RELEASE  2000  . CATARACT EXTRACTION  2014  . FINGER SURGERY  2016  . KNEE SURGERY    . PARTIAL HYSTERECTOMY  1975   has ovaries  . torn cartilage    . WRIST SURGERY Left 2014       Current Outpatient Medications  Medication Sig Dispense Refill Last Dose  . citalopram (CELEXA) 20 MG tablet Take 20 mg by mouth daily.   Taking  . ergocalciferol (VITAMIN D2) 50000 units capsule Take 50,000 Units by mouth once a week.   Taking  . fluticasone (FLONASE) 50 MCG/ACT nasal spray Place 1 spray into both nostrils daily.   Taking  . furosemide (LASIX) 20 MG tablet Take 20 mg by mouth as needed.   Taking  . loratadine (CLARITIN) 10 MG tablet Take 10 mg by mouth daily.   Taking  . meclizine (ANTIVERT) 12.5 MG tablet Take 2 tablets by mouth as needed.   Taking  . montelukast (SINGULAIR) 10 MG tablet Take 10 mg by mouth at bedtime.   Taking  . pantoprazole (PROTONIX) 40 MG tablet Take 40 mg by mouth daily.   Taking  . potassium chloride (MICRO-K) 10 MEQ CR capsule Take 10 mEq by mouth 2 (two) times daily.   Taking   Allergies  Allergen Reactions  . Omeprazole Itching  . Penicillins Rash    Social History   Tobacco Use  . Smoking status: Never Smoker  . Smokeless tobacco: Never Used  Substance Use Topics  . Alcohol use: No    Family  History  Problem Relation Age of Onset  . Heart disease Mother   . Hypertension Mother   . Stroke Mother   . Heart attack Mother   . Prostate cancer Paternal Grandfather      Review of Systems  Constitutional: Negative.   HENT: Negative.   Eyes: Negative.   Respiratory: Negative.   Cardiovascular: Negative.   Gastrointestinal: Negative.   Genitourinary: Negative.   Musculoskeletal: Positive for joint pain and myalgias. Negative for back pain, falls and neck pain.  Skin: Negative.   Neurological: Negative.   Endo/Heme/Allergies: Negative for environmental allergies and polydipsia. Bruises/bleeds easily.   Psychiatric/Behavioral: Negative for depression, hallucinations, memory loss, substance abuse and suicidal ideas. The patient is nervous/anxious. The patient does not have insomnia.     Objective:  Alert and oriented, cooperative and good historian.  Pleasant, no acute distress  Oriented X3.  Morbidly obese and Well developed.  Head normocephalic, atraumatic  Neck supple, no bruit auscultated on the right, no bruit auscultated on the left.  Pupils - Bilateral - PERR. EOMI.  Breath sounds clear at anterior chest wall and clear at posterior chest wall. No Adventitious sounds.  Regular rate and rhythm S1 WNL and S2 WNL. No Murmurs.  Abdomen is non-tender to palpation. Abdomen is soft. Bowel sounds normal.  Musculoskeletal Right Knee Exam:  No effusion.  Range of motion is 5-130 degrees.  Moderate crepitus on range of motion of the knee.  Some medial greater than lateral joint line tenderness.  Stable knee.  Distal pulses 2+. Sensation to light touch intact in LE. Calves soft and nontender.   Multiple abrasions over the right UE from skin tears.   Motor function intact in LE. Strength 5/5.  Ht: 5 ft 1 in Wt: 190 lbs BMI: 35.9  BP: 130/80  Pulse: 72 bpm  Pain Scale: 8   Imaging Review Plain radiographs demonstrate severe degenerative joint disease of the right knee(s). The overall alignment ismild varus. The bone quality appears to be fair for age and reported activity level.   Preoperative templating of the joint replacement has been completed, documented, and submitted to the Operating Room personnel in order to optimize intra-operative equipment management.   Anticipated LOS equal to or greater than 2 midnights due to - Age 14 and older with one or more of the following:  - Obesity  - Expected need for hospital services (PT, OT, Nursing) required for safe  discharge  - Anticipated need for postoperative skilled nursing care or inpatient rehab  - Active  co-morbidities: None OR   - Unanticipated findings during/Post Surgery: None  - Patient is a high risk of re-admission due to: None     Assessment/Plan:  End stage primary osteoarthritis, right knee   The patient history, physical examination, clinical judgment of the provider and imaging studies are consistent with end stage degenerative joint disease of the right knee(s) and total knee arthroplasty is deemed medically necessary. The treatment options including medical management, injection therapy arthroscopy and arthroplasty were discussed at length. The risks and benefits of total knee arthroplasty were presented and reviewed. The risks due to aseptic loosening, infection, stiffness, patella tracking problems, thromboembolic complications and other imponderables were discussed. The patient acknowledged the explanation, agreed to proceed with the plan and consent was signed. Patient is being admitted for inpatient treatment for surgery, pain control, PT, OT, prophylactic antibiotics, VTE prophylaxis, progressive ambulation and ADL's and discharge planning. The patient is planning to be discharged home.   Therapy  Plans: Pro PT 6/28 Disposition: Home with granddaughter Planned DVT prophylaxis: aspirin 325mg  BID DME needed: walker, 3-n-1 PCP: Dr. Nicholos Johns Other: TXA IV Difficulty with waking up from anesthesia in the past   Ardeen Jourdain, PA-C

## 2017-06-29 NOTE — Patient Instructions (Addendum)
Janet Diaz  [birth year]   Your procedure is scheduled on: 07-05-17   Report to [facility] Main  Entrance               Report to admitting at   0900  AM    Call this number if you have problems the morning of surgery [phone]    Remember: Do not eat food or drink liquids :After Midnight.     Take these medicines the morning of surgery with A SIP OF WATER: protonix, loratadine, flonase                                 You may not have any metal on your body including hair pins and              piercings  Do not wear jewelry, make-up, lotions, powders or perfumes, deodorant             Do not wear nail polish.  Do not shave  48 hours prior to surgery.              Do not bring valuables to the hospital. [place].  Contacts, dentures or bridgework may not be worn into surgery.  Leave suitcase in the car. After surgery it may be brought to your room.               Please read over the following fact sheets you were given: _____________________________________________________________________          [facility] - Preparing for Surgery Before surgery, you can play an important role.  Because skin is not sterile, your skin needs to be as free of germs as possible.  You can reduce the number of germs on your skin by washing with CHG (chlorahexidine gluconate) soap before surgery.  CHG is an antiseptic cleaner which kills germs and bonds with the skin to continue killing germs even after washing. Please DO NOT use if you have an allergy to CHG or antibacterial soaps.  If your skin becomes reddened/irritated stop using the CHG and inform your nurse when you arrive at Short Stay. Do not shave (including legs and underarms) for at least 48 hours prior to the first CHG shower.  You may shave your face/neck. Please follow these instructions carefully:  1.  Shower with CHG Soap the night before surgery and the   morning of Surgery.  2.  If you choose to wash your hair, wash your hair first as usual with your  normal  shampoo.  3.  After you shampoo, rinse your hair and body thoroughly to remove the  shampoo.                           4.  Use CHG as you would any other liquid soap.  You can apply chg directly  to the skin and wash                       Gently with a scrungie or clean washcloth.  5.  Apply the CHG Soap to your body ONLY FROM THE NECK DOWN.   Do not use on face/ open  Wound or open sores. Avoid contact with eyes, ears mouth and genitals (private parts).                       Wash face,  Genitals (private parts) with your normal soap.             6.  Wash thoroughly, paying special attention to the area where your surgery  will be performed.  7.  Thoroughly rinse your body with warm water from the neck down.  8.  DO NOT shower/wash with your normal soap after using and rinsing off  the CHG Soap.                9.  Pat yourself dry with a clean towel.            10.  Wear clean pajamas.            11.  Place clean sheets on your bed the night of your first shower and do not  sleep with pets. Day of Surgery : Do not apply any lotions/deodorants the morning of surgery.  Please wear clean clothes to the hospital/surgery center.  FAILURE TO FOLLOW THESE INSTRUCTIONS MAY RESULT IN THE CANCELLATION OF YOUR SURGERY PATIENT SIGNATURE_________________________________  NURSE SIGNATURE__________________________________  ________________________________________________________________________  WHAT IS A BLOOD TRANSFUSION? Blood Transfusion Information  A transfusion is the replacement of blood or some of its parts. Blood is made up of multiple cells which provide different functions.  Red blood cells carry oxygen and are used for blood loss replacement.  White blood cells fight against infection.  Platelets control bleeding.  Plasma helps clot blood.  Other blood  products are available for specialized needs, such as hemophilia or other clotting disorders. BEFORE THE TRANSFUSION  Who gives blood for transfusions?   Healthy volunteers who are fully evaluated to make sure their blood is safe. This is blood bank blood. Transfusion therapy is the safest it has ever been in the practice of medicine. Before blood is taken from a donor, a complete history is taken to make sure that person has no history of diseases nor engages in risky social behavior (examples are intravenous drug use or sexual activity with multiple partners). The donor's travel history is screened to minimize risk of transmitting infections, such as malaria. The donated blood is tested for signs of infectious diseases, such as HIV and hepatitis. The blood is then tested to be sure it is compatible with you in order to minimize the chance of a transfusion reaction. If you or a relative donates blood, this is often done in anticipation of surgery and is not appropriate for emergency situations. It takes many days to process the donated blood. RISKS AND COMPLICATIONS Although transfusion therapy is very safe and saves many lives, the main dangers of transfusion include:   Getting an infectious disease.  Developing a transfusion reaction. This is an allergic reaction to something in the blood you were given. Every precaution is taken to prevent this. The decision to have a blood transfusion has been considered carefully by your caregiver before blood is given. Blood is not given unless the benefits outweigh the risks. AFTER THE TRANSFUSION  Right after receiving a blood transfusion, you will usually feel much better and more energetic. This is especially true if your red blood cells have gotten low (anemic). The transfusion raises the level of the red blood cells which carry oxygen, and this usually causes an energy increase.  The  nurse administering the transfusion will monitor you carefully for  complications. HOME CARE INSTRUCTIONS  No special instructions are needed after a transfusion. You may find your energy is better. Speak with your caregiver about any limitations on activity for underlying diseases you may have. SEEK MEDICAL CARE IF:   Your condition is not improving after your transfusion.  You develop redness or irritation at the intravenous (IV) site. SEEK IMMEDIATE MEDICAL CARE IF:  Any of the following symptoms occur over the next 12 hours:  Shaking chills.  You have a temperature by mouth above 102 F (38.9 C), not controlled by medicine.  Chest, back, or muscle pain.  People around you feel you are not acting correctly or are confused.  Shortness of breath or difficulty breathing.  Dizziness and fainting.  You get a rash or develop hives.  You have a decrease in urine output.  Your urine turns a dark color or changes to pink, red, or brown. Any of the following symptoms occur over the next 10 days:  You have a temperature by mouth above 102 F (38.9 C), not controlled by medicine.  Shortness of breath.  Weakness after normal activity.  The white part of the eye turns yellow (jaundice).  You have a decrease in the amount of urine or are urinating less often.  Your urine turns a dark color or changes to pink, red, or brown. Document Released: 12/27/1999 Document Revised: 03/23/2011 Document Reviewed: 08/15/2007 ExitCare Patient Information 2014 [place].  _______________________________________________________________________  Incentive Spirometer  An incentive spirometer is a tool that can help keep your lungs clear and active. This tool measures how well you are filling your lungs with each breath. Taking long deep breaths may help reverse or decrease the chance of developing breathing (pulmonary) problems (especially infection) following:  A long period of time when you are unable to move or be active. BEFORE THE PROCEDURE   If  the spirometer includes an indicator to show your best effort, your nurse or respiratory therapist will set it to a desired goal.  If possible, sit up straight or lean slightly forward. Try not to slouch.  Hold the incentive spirometer in an upright position. INSTRUCTIONS FOR USE  1. Sit on the edge of your bed if possible, or sit up as far as you can in bed or on a chair. 2. Hold the incentive spirometer in an upright position. 3. Breathe out normally. 4. Place the mouthpiece in your mouth and seal your lips tightly around it. 5. Breathe in slowly and as deeply as possible, raising the piston or the ball toward the top of the column. 6. Hold your breath for 3-5 seconds or for as long as possible. Allow the piston or ball to fall to the bottom of the column. 7. Remove the mouthpiece from your mouth and breathe out normally. 8. Rest for a few seconds and repeat Steps 1 through 7 at least 10 times every 1-2 hours when you are awake. Take your time and take a few normal breaths between deep breaths. 9. The spirometer may include an indicator to show your best effort. Use the indicator as a goal to work toward during each repetition. 10. After each set of 10 deep breaths, practice coughing to be sure your lungs are clear. If you have an incision (the cut made at the time of surgery), support your incision when coughing by placing a pillow or rolled up towels firmly against it. Once you are able to get  out of bed, walk around indoors and cough well. You may stop using the incentive spirometer when instructed by your caregiver.  RISKS AND COMPLICATIONS  Take your time so you do not get dizzy or light-headed.  If you are in pain, you may need to take or ask for pain medication before doing incentive spirometry. It is harder to take a deep breath if you are having pain. AFTER USE  Rest and breathe slowly and easily.  It can be helpful to keep track of a log of your progress. Your caregiver can  provide you with a simple table to help with this. If you are using the spirometer at home, follow these instructions: [place] IF:   You are having difficultly using the spirometer.  You have trouble using the spirometer as often as instructed.  Your pain medication is not giving enough relief while using the spirometer.  You develop fever of 100.5 F (38.1 C) or higher. SEEK IMMEDIATE MEDICAL CARE IF:   You cough up bloody sputum that had not been present before.  You develop fever of 102 F (38.9 C) or greater.  You develop worsening pain at or near the incision site. MAKE SURE YOU:   Understand these instructions.  Will watch your condition.  Will get help right away if you are not doing well or get worse. Document Released: 05/11/2006 Document Revised: 03/23/2011 Document Reviewed: 07/12/2006 [facility] Patient Information 2014 [place], [place].   ________________________________________________________________________

## 2017-07-01 ENCOUNTER — Encounter (HOSPITAL_COMMUNITY)
Admission: RE | Admit: 2017-07-01 | Discharge: 2017-07-01 | Disposition: A | Discharge status: Home / Self Care | Payer: Medicare Other | Source: Ambulatory Visit | Attending: Orthopedic Surgery | Admitting: Orthopedic Surgery

## 2017-07-01 ENCOUNTER — Other Ambulatory Visit: Payer: Self-pay

## 2017-07-01 ENCOUNTER — Encounter (HOSPITAL_COMMUNITY): Payer: Self-pay

## 2017-07-01 DIAGNOSIS — Z01812 Encounter for preprocedural laboratory examination: Secondary | ICD-10-CM | POA: Diagnosis not present

## 2017-07-01 HISTORY — DX: Other complications of anesthesia, initial encounter: T88.59XA

## 2017-07-01 HISTORY — DX: Adverse effect of unspecified anesthetic, initial encounter: T41.45XA

## 2017-07-01 LAB — CBC
HCT: 41.9 % (ref 36.0–46.0)
Hemoglobin: 13.6 g/dL (ref 12.0–15.0)
MCH: 31.2 pg (ref 26.0–34.0)
MCHC: 32.5 g/dL (ref 30.0–36.0)
MCV: 96.1 fL (ref 78.0–100.0)
Platelets: 193 10*3/uL (ref 150–400)
RBC: 4.36 MIL/uL (ref 3.87–5.11)
RDW: 13.4 % (ref 11.5–15.5)
WBC: 6 10*3/uL (ref 4.0–10.5)

## 2017-07-01 LAB — ABO/RH: ABO/RH(D): O POS

## 2017-07-01 LAB — COMPREHENSIVE METABOLIC PANEL
ALT: 16 U/L (ref 14–54)
AST: 23 U/L (ref 15–41)
Albumin: 4.1 g/dL (ref 3.5–5.0)
Alkaline Phosphatase: 90 U/L (ref 38–126)
Anion gap: 7 (ref 5–15)
BUN: 22 mg/dL — ABNORMAL HIGH (ref 6–20)
CO2: 30 mmol/L (ref 22–32)
Calcium: 9 mg/dL (ref 8.9–10.3)
Chloride: 105 mmol/L (ref 101–111)
Creatinine, Ser: 0.92 mg/dL (ref 0.44–1.00)
GFR calc Af Amer: >60 mL/min (ref >60)
GFR calc non Af Amer: >60 mL/min (ref >60)
Glucose, Bld: 105 mg/dL — ABNORMAL HIGH (ref 65–99)
Potassium: 4.6 mmol/L (ref 3.5–5.1)
Sodium: 142 mmol/L (ref 135–145)
Total Bilirubin: 1 mg/dL (ref 0.3–1.2)
Total Protein: 7.1 g/dL (ref 6.5–8.1)

## 2017-07-01 LAB — APTT: aPTT: 26 seconds (ref 24–36)

## 2017-07-01 LAB — SURGICAL PCR SCREEN
MRSA, PCR: NEGATIVE
Staphylococcus aureus: NEGATIVE

## 2017-07-01 LAB — PROTIME-INR
INR: 1
Prothrombin Time: 13.1 seconds (ref 11.4–15.2)

## 2017-07-01 NOTE — Progress Notes (Addendum)
06-02-17 ekg epic  stress 06-04-17 epic echo 06-09-17 epic clearane Dr. Rica Records on chart

## 2017-07-02 ENCOUNTER — Other Ambulatory Visit: Payer: Self-pay | Admitting: Orthopedic Surgery

## 2017-07-02 NOTE — Care Plan (Signed)
Ortho Bundle Scheduled R TKA on 07-05-17 DCP:  Home with granddtr and some assist from her pastor's spouse.  2 story home with 1 ste. DME:  Rx for RW and 3-in-1 given  PT:  ProPT.  Eval scheduled for 07-09-17.

## 2017-07-04 MED ORDER — BUPIVACAINE LIPOSOME 1.3 % IJ SUSP
20.0000 mL | INTRAMUSCULAR | Status: DC
  Filled 2017-07-04: qty 20

## 2017-07-04 MED ORDER — TRANEXAMIC ACID 1000 MG/10ML IV SOLN
1000.0000 mg | INTRAVENOUS | Status: AC
  Administered 2017-07-05: 1000 mg via INTRAVENOUS
  Filled 2017-07-04: qty 1100
  Filled 2017-07-04: qty 10

## 2017-07-05 ENCOUNTER — Inpatient Hospital Stay (HOSPITAL_COMMUNITY): Payer: Medicare Other | Admitting: Anesthesiology

## 2017-07-05 ENCOUNTER — Inpatient Hospital Stay (HOSPITAL_COMMUNITY)
Admission: RE | Admit: 2017-07-05 | Discharge: 2017-07-07 | DRG: 470 | Disposition: A | Discharge status: Home / Self Care | Payer: Medicare Other | Attending: Orthopedic Surgery | Admitting: Orthopedic Surgery

## 2017-07-05 ENCOUNTER — Encounter (HOSPITAL_COMMUNITY): Payer: Self-pay | Admitting: *Deleted

## 2017-07-05 ENCOUNTER — Other Ambulatory Visit: Payer: Self-pay

## 2017-07-05 ENCOUNTER — Encounter (HOSPITAL_COMMUNITY)
Admission: RE | Disposition: A | Discharge status: Home / Self Care | Payer: Self-pay | Source: Home / Self Care | Attending: Orthopedic Surgery

## 2017-07-05 DIAGNOSIS — G8918 Other acute postprocedural pain: Secondary | ICD-10-CM | POA: Diagnosis not present

## 2017-07-05 DIAGNOSIS — E538 Deficiency of other specified B group vitamins: Secondary | ICD-10-CM | POA: Diagnosis present

## 2017-07-05 DIAGNOSIS — M5416 Radiculopathy, lumbar region: Secondary | ICD-10-CM | POA: Diagnosis not present

## 2017-07-05 DIAGNOSIS — F419 Anxiety disorder, unspecified: Secondary | ICD-10-CM | POA: Diagnosis present

## 2017-07-05 DIAGNOSIS — Z809 Family history of malignant neoplasm, unspecified: Secondary | ICD-10-CM | POA: Diagnosis not present

## 2017-07-05 DIAGNOSIS — E785 Hyperlipidemia, unspecified: Secondary | ICD-10-CM | POA: Diagnosis present

## 2017-07-05 DIAGNOSIS — Z823 Family history of stroke: Secondary | ICD-10-CM | POA: Diagnosis not present

## 2017-07-05 DIAGNOSIS — Z8249 Family history of ischemic heart disease and other diseases of the circulatory system: Secondary | ICD-10-CM | POA: Diagnosis not present

## 2017-07-05 DIAGNOSIS — K219 Gastro-esophageal reflux disease without esophagitis: Secondary | ICD-10-CM | POA: Diagnosis present

## 2017-07-05 DIAGNOSIS — X58XXXA Exposure to other specified factors, initial encounter: Secondary | ICD-10-CM | POA: Diagnosis present

## 2017-07-05 DIAGNOSIS — F329 Major depressive disorder, single episode, unspecified: Secondary | ICD-10-CM | POA: Diagnosis present

## 2017-07-05 DIAGNOSIS — M25761 Osteophyte, right knee: Secondary | ICD-10-CM | POA: Diagnosis present

## 2017-07-05 DIAGNOSIS — S40811A Abrasion of right upper arm, initial encounter: Secondary | ICD-10-CM | POA: Diagnosis present

## 2017-07-05 DIAGNOSIS — Z9849 Cataract extraction status, unspecified eye: Secondary | ICD-10-CM | POA: Diagnosis not present

## 2017-07-05 DIAGNOSIS — Z79899 Other long term (current) drug therapy: Secondary | ICD-10-CM

## 2017-07-05 DIAGNOSIS — Z90711 Acquired absence of uterus with remaining cervical stump: Secondary | ICD-10-CM

## 2017-07-05 DIAGNOSIS — M1711 Unilateral primary osteoarthritis, right knee: Secondary | ICD-10-CM | POA: Diagnosis present

## 2017-07-05 DIAGNOSIS — E559 Vitamin D deficiency, unspecified: Secondary | ICD-10-CM | POA: Diagnosis present

## 2017-07-05 DIAGNOSIS — Z6835 Body mass index (BMI) 35.0-35.9, adult: Secondary | ICD-10-CM | POA: Diagnosis not present

## 2017-07-05 DIAGNOSIS — K5730 Diverticulosis of large intestine without perforation or abscess without bleeding: Secondary | ICD-10-CM | POA: Diagnosis not present

## 2017-07-05 DIAGNOSIS — M179 Osteoarthritis of knee, unspecified: Secondary | ICD-10-CM | POA: Diagnosis present

## 2017-07-05 DIAGNOSIS — M1710 Unilateral primary osteoarthritis, unspecified knee: Secondary | ICD-10-CM

## 2017-07-05 HISTORY — PX: TOTAL KNEE ARTHROPLASTY: SHX125

## 2017-07-05 LAB — TYPE AND SCREEN
ABO/RH(D): O POS
Antibody Screen: NEGATIVE

## 2017-07-05 SURGERY — ARTHROPLASTY, KNEE, TOTAL
Anesthesia: Regional | Site: Knee | Laterality: Right

## 2017-07-05 MED ORDER — MECLIZINE HCL 25 MG PO TABS: 12.5000 mg | ORAL_TABLET | Freq: Two times a day (BID) | ORAL | Status: DC | PRN

## 2017-07-05 MED ORDER — DEXAMETHASONE SODIUM PHOSPHATE 10 MG/ML IJ SOLN
8.0000 mg | Freq: Once | INTRAMUSCULAR | Status: AC
  Administered 2017-07-05: 8 mg via INTRAVENOUS

## 2017-07-05 MED ORDER — DEXAMETHASONE SODIUM PHOSPHATE 10 MG/ML IJ SOLN
10.0000 mg | Freq: Once | INTRAMUSCULAR | Status: AC
  Administered 2017-07-06: 10 mg via INTRAVENOUS
  Filled 2017-07-05: qty 1

## 2017-07-05 MED ORDER — BUPIVACAINE IN DEXTROSE 0.75-8.25 % IT SOLN
INTRATHECAL | Status: DC | PRN
  Administered 2017-07-05: 1.6 mL via INTRATHECAL

## 2017-07-05 MED ORDER — FLEET ENEMA 7-19 GM/118ML RE ENEM: 1.0000 | ENEMA | Freq: Once | RECTAL | Status: DC | PRN

## 2017-07-05 MED ORDER — CITALOPRAM HYDROBROMIDE 20 MG PO TABS
40.0000 mg | ORAL_TABLET | Freq: Every day | ORAL | Status: DC
  Administered 2017-07-05 – 2017-07-06 (×3): 40 mg via ORAL
  Filled 2017-07-05 (×2): qty 2

## 2017-07-05 MED ORDER — METHOCARBAMOL 500 MG PO TABS
500.0000 mg | ORAL_TABLET | Freq: Four times a day (QID) | ORAL | Status: DC | PRN
  Administered 2017-07-05 – 2017-07-06 (×3): 500 mg via ORAL
  Filled 2017-07-05 (×3): qty 1

## 2017-07-05 MED ORDER — MIDAZOLAM HCL 2 MG/2ML IJ SOLN
INTRAMUSCULAR | Status: AC
  Administered 2017-07-05: 2 mg via INTRAVENOUS
  Filled 2017-07-05: qty 2

## 2017-07-05 MED ORDER — POLYETHYLENE GLYCOL 3350 17 G PO PACK: 17.0000 g | PACK | Freq: Every day | ORAL | Status: DC | PRN

## 2017-07-05 MED ORDER — SODIUM CHLORIDE 0.9 % IR SOLN
Status: DC | PRN
  Administered 2017-07-05: 1000 mL

## 2017-07-05 MED ORDER — DOCUSATE SODIUM 100 MG PO CAPS
100.0000 mg | ORAL_CAPSULE | Freq: Two times a day (BID) | ORAL | Status: DC
  Administered 2017-07-05 – 2017-07-07 (×4): 100 mg via ORAL
  Filled 2017-07-05 (×4): qty 1

## 2017-07-05 MED ORDER — BISACODYL 10 MG RE SUPP: 10.0000 mg | Freq: Every day | RECTAL | Status: DC | PRN

## 2017-07-05 MED ORDER — SODIUM CHLORIDE 0.9 % IJ SOLN
INTRAMUSCULAR | Status: AC
  Filled 2017-07-05: qty 50

## 2017-07-05 MED ORDER — BUPIVACAINE LIPOSOME 1.3 % IJ SUSP
INTRAMUSCULAR | Status: DC | PRN
  Administered 2017-07-05: 20 mL

## 2017-07-05 MED ORDER — MIDAZOLAM HCL 2 MG/2ML IJ SOLN
1.0000 mg | INTRAMUSCULAR | Status: DC
  Administered 2017-07-05: 2 mg via INTRAVENOUS

## 2017-07-05 MED ORDER — ONDANSETRON HCL 4 MG/2ML IJ SOLN
INTRAMUSCULAR | Status: AC
  Filled 2017-07-05: qty 2

## 2017-07-05 MED ORDER — HYDROMORPHONE HCL 1 MG/ML IJ SOLN
0.5000 mg | INTRAMUSCULAR | Status: DC | PRN
  Administered 2017-07-05: 1 mg via INTRAVENOUS
  Filled 2017-07-05 (×2): qty 1

## 2017-07-05 MED ORDER — ONDANSETRON HCL 4 MG/2ML IJ SOLN: 4.0000 mg | Freq: Once | INTRAMUSCULAR | Status: DC | PRN

## 2017-07-05 MED ORDER — ACETAMINOPHEN 10 MG/ML IV SOLN
1000.0000 mg | Freq: Four times a day (QID) | INTRAVENOUS | Status: DC
  Administered 2017-07-05: 1000 mg via INTRAVENOUS
  Filled 2017-07-05 (×2): qty 100

## 2017-07-05 MED ORDER — ACETAMINOPHEN 325 MG PO TABS: 325.0000 mg | ORAL_TABLET | Freq: Four times a day (QID) | ORAL | Status: DC | PRN

## 2017-07-05 MED ORDER — METOCLOPRAMIDE HCL 5 MG/ML IJ SOLN: 5.0000 mg | Freq: Three times a day (TID) | INTRAMUSCULAR | Status: DC | PRN

## 2017-07-05 MED ORDER — FENTANYL CITRATE (PF) 100 MCG/2ML IJ SOLN
INTRAMUSCULAR | Status: AC
  Administered 2017-07-05: 50 ug via INTRAVENOUS
  Filled 2017-07-05: qty 2

## 2017-07-05 MED ORDER — SODIUM CHLORIDE 0.9 % IJ SOLN
INTRAMUSCULAR | Status: AC
  Filled 2017-07-05: qty 10

## 2017-07-05 MED ORDER — EPHEDRINE 5 MG/ML INJ
INTRAVENOUS | Status: AC
  Filled 2017-07-05: qty 10

## 2017-07-05 MED ORDER — OXYCODONE HCL 5 MG PO TABS
5.0000 mg | ORAL_TABLET | ORAL | Status: DC | PRN
  Administered 2017-07-05: 5 mg via ORAL
  Administered 2017-07-06 (×2): 10 mg via ORAL
  Filled 2017-07-05 (×2): qty 2
  Filled 2017-07-05: qty 1
  Filled 2017-07-05: qty 2
  Filled 2017-07-05: qty 1
  Filled 2017-07-05: qty 2

## 2017-07-05 MED ORDER — METOCLOPRAMIDE HCL 5 MG PO TABS: 5.0000 mg | ORAL_TABLET | Freq: Three times a day (TID) | ORAL | Status: DC | PRN

## 2017-07-05 MED ORDER — DIPHENHYDRAMINE HCL 12.5 MG/5ML PO ELIX
12.5000 mg | ORAL_SOLUTION | ORAL | Status: DC | PRN
  Administered 2017-07-07 (×2): 12.5 mg via ORAL
  Filled 2017-07-05 (×2): qty 5

## 2017-07-05 MED ORDER — FENTANYL CITRATE (PF) 100 MCG/2ML IJ SOLN: 25.0000 ug | INTRAMUSCULAR | Status: DC | PRN

## 2017-07-05 MED ORDER — VANCOMYCIN HCL IN DEXTROSE 1-5 GM/200ML-% IV SOLN
1000.0000 mg | Freq: Two times a day (BID) | INTRAVENOUS | Status: AC
  Administered 2017-07-05: 1000 mg via INTRAVENOUS
  Filled 2017-07-05 (×2): qty 200

## 2017-07-05 MED ORDER — ONDANSETRON HCL 4 MG/2ML IJ SOLN
INTRAMUSCULAR | Status: DC | PRN
  Administered 2017-07-05: 4 mg via INTRAVENOUS

## 2017-07-05 MED ORDER — ROPIVACAINE HCL 5 MG/ML IJ SOLN
INTRAMUSCULAR | Status: DC | PRN
  Administered 2017-07-05: 30 mL via PERINEURAL

## 2017-07-05 MED ORDER — MENTHOL 3 MG MT LOZG: 1.0000 | LOZENGE | OROMUCOSAL | Status: DC | PRN

## 2017-07-05 MED ORDER — POTASSIUM CHLORIDE CRYS ER 10 MEQ PO TBCR
10.0000 meq | EXTENDED_RELEASE_TABLET | Freq: Every day | ORAL | Status: DC
  Administered 2017-07-06 – 2017-07-07 (×2): 10 meq via ORAL
  Filled 2017-07-05 (×2): qty 1

## 2017-07-05 MED ORDER — EPHEDRINE SULFATE-NACL 50-0.9 MG/10ML-% IV SOSY
PREFILLED_SYRINGE | INTRAVENOUS | Status: DC | PRN
  Administered 2017-07-05 (×2): 10 mg via INTRAVENOUS
  Administered 2017-07-05: 15 mg via INTRAVENOUS

## 2017-07-05 MED ORDER — ONDANSETRON HCL 4 MG PO TABS: 4.0000 mg | ORAL_TABLET | Freq: Four times a day (QID) | ORAL | Status: DC | PRN

## 2017-07-05 MED ORDER — FUROSEMIDE 20 MG PO TABS
20.0000 mg | ORAL_TABLET | Freq: Every day | ORAL | Status: DC
  Administered 2017-07-06 – 2017-07-07 (×2): 20 mg via ORAL
  Filled 2017-07-05 (×2): qty 1

## 2017-07-05 MED ORDER — CHLORHEXIDINE GLUCONATE 4 % EX LIQD: 60.0000 mL | Freq: Once | CUTANEOUS | Status: DC

## 2017-07-05 MED ORDER — SODIUM CHLORIDE 0.9 % IV SOLN
INTRAVENOUS | Status: DC
  Administered 2017-07-05 – 2017-07-06 (×2): via INTRAVENOUS

## 2017-07-05 MED ORDER — OXYCODONE HCL 5 MG PO TABS
10.0000 mg | ORAL_TABLET | ORAL | Status: DC | PRN
  Administered 2017-07-05: 15 mg via ORAL
  Administered 2017-07-06: 10 mg via ORAL
  Administered 2017-07-06 – 2017-07-07 (×4): 15 mg via ORAL
  Filled 2017-07-05 (×4): qty 3

## 2017-07-05 MED ORDER — ONDANSETRON HCL 4 MG/2ML IJ SOLN: 4.0000 mg | Freq: Four times a day (QID) | INTRAMUSCULAR | Status: DC | PRN

## 2017-07-05 MED ORDER — FLUTICASONE PROPIONATE 50 MCG/ACT NA SUSP
1.0000 | Freq: Two times a day (BID) | NASAL | Status: DC
  Administered 2017-07-05 – 2017-07-06 (×3): 1 via NASAL
  Filled 2017-07-05: qty 16

## 2017-07-05 MED ORDER — LACTATED RINGERS IV SOLN
INTRAVENOUS | Status: DC
  Administered 2017-07-05 (×2): via INTRAVENOUS

## 2017-07-05 MED ORDER — SODIUM CHLORIDE 0.9 % IJ SOLN
INTRAMUSCULAR | Status: DC | PRN
  Administered 2017-07-05: 60 mL

## 2017-07-05 MED ORDER — LORATADINE 10 MG PO TABS
10.0000 mg | ORAL_TABLET | Freq: Every day | ORAL | Status: DC
  Administered 2017-07-06 – 2017-07-07 (×2): 10 mg via ORAL
  Filled 2017-07-05 (×2): qty 1

## 2017-07-05 MED ORDER — PROPOFOL 10 MG/ML IV BOLUS
INTRAVENOUS | Status: AC
  Filled 2017-07-05: qty 40

## 2017-07-05 MED ORDER — MONTELUKAST SODIUM 10 MG PO TABS
10.0000 mg | ORAL_TABLET | Freq: Every day | ORAL | Status: DC
  Administered 2017-07-05 – 2017-07-06 (×2): 10 mg via ORAL
  Filled 2017-07-05 (×2): qty 1

## 2017-07-05 MED ORDER — ASPIRIN EC 325 MG PO TBEC
325.0000 mg | DELAYED_RELEASE_TABLET | Freq: Two times a day (BID) | ORAL | Status: DC
  Administered 2017-07-06 – 2017-07-07 (×3): 325 mg via ORAL
  Filled 2017-07-05 (×3): qty 1

## 2017-07-05 MED ORDER — DEXAMETHASONE SODIUM PHOSPHATE 10 MG/ML IJ SOLN
INTRAMUSCULAR | Status: AC
  Filled 2017-07-05: qty 1

## 2017-07-05 MED ORDER — FENTANYL CITRATE (PF) 100 MCG/2ML IJ SOLN
50.0000 ug | INTRAMUSCULAR | Status: DC
  Administered 2017-07-05 (×2): 50 ug via INTRAVENOUS

## 2017-07-05 MED ORDER — PROPOFOL 500 MG/50ML IV EMUL
INTRAVENOUS | Status: DC | PRN
  Administered 2017-07-05: 100 ug/kg/min via INTRAVENOUS

## 2017-07-05 MED ORDER — METHOCARBAMOL 1000 MG/10ML IJ SOLN
500.0000 mg | Freq: Four times a day (QID) | INTRAVENOUS | Status: DC | PRN
  Filled 2017-07-05: qty 5

## 2017-07-05 MED ORDER — ACETAMINOPHEN 500 MG PO TABS
1000.0000 mg | ORAL_TABLET | Freq: Four times a day (QID) | ORAL | Status: AC
  Administered 2017-07-05 – 2017-07-06 (×4): 1000 mg via ORAL
  Filled 2017-07-05 (×4): qty 2

## 2017-07-05 MED ORDER — VANCOMYCIN HCL IN DEXTROSE 1-5 GM/200ML-% IV SOLN
1000.0000 mg | INTRAVENOUS | Status: AC
  Administered 2017-07-05: 1000 mg via INTRAVENOUS
  Filled 2017-07-05: qty 200

## 2017-07-05 MED ORDER — PHENOL 1.4 % MT LIQD: 1.0000 | OROMUCOSAL | Status: DC | PRN

## 2017-07-05 MED ORDER — PANTOPRAZOLE SODIUM 40 MG PO TBEC
40.0000 mg | DELAYED_RELEASE_TABLET | Freq: Every day | ORAL | Status: DC
  Administered 2017-07-06 – 2017-07-07 (×2): 40 mg via ORAL
  Filled 2017-07-05 (×2): qty 1

## 2017-07-05 SURGICAL SUPPLY — 52 items
BAG SPEC THK2 15X12 ZIP CLS (MISCELLANEOUS) ×1
BAG ZIPLOCK 12X15 (MISCELLANEOUS) ×3 IMPLANT
BANDAGE ACE 6X5 VEL STRL LF (GAUZE/BANDAGES/DRESSINGS) ×3 IMPLANT
BLADE SAG 18X100X1.27 (BLADE) ×3 IMPLANT
BLADE SAW SGTL 11.0X1.19X90.0M (BLADE) ×3 IMPLANT
BOWL SMART MIX CTS (DISPOSABLE) ×3 IMPLANT
CAP KNEE TOTAL 3 SIGMA ×2 IMPLANT
CEMENT HV SMART SET (Cement) ×6 IMPLANT
CLOSURE WOUND 1/2 X4 (GAUZE/BANDAGES/DRESSINGS) ×1
COVER SURGICAL LIGHT HANDLE (MISCELLANEOUS) ×3 IMPLANT
CUFF TOURN SGL QUICK 34 (TOURNIQUET CUFF) ×3
CUFF TRNQT CYL 34X4X40X1 (TOURNIQUET CUFF) ×1 IMPLANT
DECANTER SPIKE VIAL GLASS SM (MISCELLANEOUS) ×3 IMPLANT
DRAPE U-SHAPE 47X51 STRL (DRAPES) ×3 IMPLANT
DRSG ADAPTIC 3X8 NADH LF (GAUZE/BANDAGES/DRESSINGS) ×3 IMPLANT
DRSG PAD ABDOMINAL 8X10 ST (GAUZE/BANDAGES/DRESSINGS) ×3 IMPLANT
DURAPREP 26ML APPLICATOR (WOUND CARE) ×3 IMPLANT
ELECT REM PT RETURN 15FT ADLT (MISCELLANEOUS) ×3 IMPLANT
EVACUATOR 1/8 PVC DRAIN (DRAIN) ×3 IMPLANT
GAUZE SPONGE 4X4 12PLY STRL (GAUZE/BANDAGES/DRESSINGS) ×3 IMPLANT
GLOVE BIO SURGEON STRL SZ 6.5 (GLOVE) ×2 IMPLANT
GLOVE BIO SURGEON STRL SZ8 (GLOVE) ×6 IMPLANT
GLOVE BIO SURGEONS STRL SZ 6.5 (GLOVE) ×1
GLOVE BIOGEL PI IND STRL 6.5 (GLOVE) ×1 IMPLANT
GLOVE BIOGEL PI IND STRL 7.0 (GLOVE) ×2 IMPLANT
GLOVE BIOGEL PI IND STRL 8 (GLOVE) ×1 IMPLANT
GLOVE BIOGEL PI INDICATOR 6.5 (GLOVE) ×2
GLOVE BIOGEL PI INDICATOR 7.0 (GLOVE) ×4
GLOVE BIOGEL PI INDICATOR 8 (GLOVE) ×2
GLOVE SURG SS PI 6.5 STRL IVOR (GLOVE) ×3 IMPLANT
GOWN STRL REUS W/TWL LRG LVL3 (GOWN DISPOSABLE) ×6 IMPLANT
HANDPIECE INTERPULSE COAX TIP (DISPOSABLE) ×3
HOLDER FOLEY CATH W/STRAP (MISCELLANEOUS) IMPLANT
IMMOBILIZER KNEE 20 (SOFTGOODS) ×3
IMMOBILIZER KNEE 20 THIGH 36 (SOFTGOODS) ×1 IMPLANT
MANIFOLD NEPTUNE II (INSTRUMENTS) ×3 IMPLANT
NS IRRIG 1000ML POUR BTL (IV SOLUTION) ×3 IMPLANT
PACK TOTAL KNEE CUSTOM (KITS) ×3 IMPLANT
PAD ABD 8X10 STRL (GAUZE/BANDAGES/DRESSINGS) ×2 IMPLANT
PADDING CAST COTTON 6X4 STRL (CAST SUPPLIES) ×7 IMPLANT
POSITIONER SURGICAL ARM (MISCELLANEOUS) ×3 IMPLANT
SET HNDPC FAN SPRY TIP SCT (DISPOSABLE) ×1 IMPLANT
STRIP CLOSURE SKIN 1/2X4 (GAUZE/BANDAGES/DRESSINGS) ×3 IMPLANT
SUT MNCRL AB 4-0 PS2 18 (SUTURE) ×3 IMPLANT
SUT STRATAFIX 0 PDS 27 VIOLET (SUTURE) ×3
SUT VIC AB 2-0 CT1 27 (SUTURE) ×9
SUT VIC AB 2-0 CT1 TAPERPNT 27 (SUTURE) ×3 IMPLANT
SUTURE STRATFX 0 PDS 27 VIOLET (SUTURE) ×1 IMPLANT
TRAY FOLEY MTR SLVR 16FR STAT (SET/KITS/TRAYS/PACK) ×3 IMPLANT
WATER STERILE IRR 1000ML POUR (IV SOLUTION) ×6 IMPLANT
WRAP KNEE MAXI GEL POST OP (GAUZE/BANDAGES/DRESSINGS) ×3 IMPLANT
YANKAUER SUCT BULB TIP 10FT TU (MISCELLANEOUS) ×3 IMPLANT

## 2017-07-05 NOTE — Anesthesia Postprocedure Evaluation (Signed)
Anesthesia Post Note  Patient: Janet Diaz  Procedure(s) Performed: RIGHT TOTAL KNEE ARTHROPLASTY (Right Knee)     Patient location during evaluation: PACU Anesthesia Type: Regional and Spinal Level of consciousness: oriented and awake and alert Pain management: pain level controlled Vital Signs Assessment: post-procedure vital signs reviewed and stable Respiratory status: spontaneous breathing, respiratory function stable and patient connected to nasal cannula oxygen Cardiovascular status: blood pressure returned to baseline and stable Postop Assessment: no headache, no backache and no apparent nausea or vomiting Anesthetic complications: no    Last Vitals:  Vitals:   07/05/17 1504 07/05/17 1606  BP: 123/83 124/61  Pulse: 66 66  Resp: 16 18  Temp: 36.6 C 36.4 C  SpO2: 100% 99%    Last Pain:  Vitals:   07/05/17 1606  TempSrc: Oral  PainSc:                  Kinney Sackmann P Fahd Galea

## 2017-07-05 NOTE — Discharge Instructions (Signed)
° °Dr. Frank Aluisio °Total Joint Specialist °[facility] °[address] °[place], [place] [address] °[phone] ° °TOTAL KNEE REPLACEMENT POSTOPERATIVE DIRECTIONS ° °Knee Rehabilitation, Guidelines Following Surgery  °Results after knee surgery are often greatly improved when you follow the exercise, range of motion and muscle strengthening exercises prescribed by your doctor. Safety measures are also important to protect the knee from further injury. Any time any of these exercises cause you to have increased pain or swelling in your knee joint, decrease the amount until you are comfortable again and slowly increase them. If you have problems or questions, call your caregiver or physical therapist for advice.  ° °HOME CARE INSTRUCTIONS  °• Remove items at home which could result in a fall. This includes throw rugs or furniture in walking pathways.  °· ICE to the affected knee every three hours for 30 minutes at a time and then as needed for pain and swelling.  Continue to use ice on the knee for pain and swelling from surgery. You may notice swelling that will progress down to the foot and ankle.  This is normal after surgery.  Elevate the leg when you are not up walking on it.   °· Continue to use the breathing machine which will help keep your temperature down.  It is common for your temperature to cycle up and down following surgery, especially at night when you are not up moving around and exerting yourself.  The breathing machine keeps your lungs expanded and your temperature down. °· Do not place pillow under knee, focus on keeping the knee straight while resting ° °DIET °You may resume your previous home diet once your are discharged from the hospital. ° °DRESSING / WOUND CARE / SHOWERING °You may shower 3 days after surgery, but keep the wounds dry during showering.  You may use an occlusive plastic wrap (Press'n Seal for example), NO SOAKING/SUBMERGING IN THE BATHTUB.  If the bandage  gets wet, change with a clean dry gauze.  If the incision gets wet, pat the wound dry with a clean towel. °You may start showering once you are discharged home but do not submerge the incision under water. Just pat the incision dry and apply a dry gauze dressing on daily. °Change the surgical dressing daily and reapply a dry dressing each time. ° °ACTIVITY °Walk with your walker as instructed. °Use walker as long as suggested by your caregivers. °Avoid periods of inactivity such as sitting longer than an hour when not asleep. This helps prevent blood clots.  °You may resume a sexual relationship in one month or when given the OK by your doctor.  °You may return to work once you are cleared by your doctor.  °Do not drive a car for 6 weeks or until released by you surgeon.  °Do not drive while taking narcotics. ° °WEIGHT BEARING °Weight bearing as tolerated with assist device (walker, cane, etc) as directed, use it as long as suggested by your surgeon or therapist, typically at least 4-6 weeks. ° °POSTOPERATIVE CONSTIPATION PROTOCOL °Constipation - defined medically as fewer than three stools per week and severe constipation as less than one stool per week. ° °One of the most common issues patients have following surgery is constipation.  Even if you have a regular bowel pattern at home, your normal regimen is likely to be disrupted due to multiple reasons following surgery.  Combination of anesthesia, postoperative narcotics, change in appetite and fluid intake all can affect your bowels.    In order to avoid complications following surgery, here are some recommendations in order to help you during your recovery period. ° °Colace (docusate) - Pick up an over-the-counter form of Colace or another stool softener and take twice a day as long as you are requiring postoperative pain medications.  Take with a full glass of water daily.  If you experience loose stools or diarrhea, hold the colace until you stool forms back  up.  If your symptoms do not get better within 1 week or if they get worse, check with your doctor. ° °Dulcolax (bisacodyl) - Pick up over-the-counter and take as directed by the product packaging as needed to assist with the movement of your bowels.  Take with a full glass of water.  Use this product as needed if not relieved by Colace only.  ° °MiraLax (polyethylene glycol) - Pick up over-the-counter to have on hand.  MiraLax is a solution that will increase the amount of water in your bowels to assist with bowel movements.  Take as directed and can mix with a glass of water, juice, soda, coffee, or tea.  Take if you go more than two days without a movement. °Do not use MiraLax more than once per day. Call your doctor if you are still constipated or irregular after using this medication for 7 days in a row. ° °If you continue to have problems with postoperative constipation, please contact the office for further assistance and recommendations.  If you experience "the worst abdominal pain ever" or develop nausea or vomiting, please contact the office immediatly for further recommendations for treatment. ° °ITCHING ° If you experience itching with your medications, try taking only a single pain pill, or even half a pain pill at a time.  You can also use Benadryl over the counter for itching or also to help with sleep.  ° °TED HOSE STOCKINGS °Wear the elastic stockings on both legs for three weeks following surgery during the day but you may remove then at night for sleeping. ° °MEDICATIONS °See your medication summary on the “After Visit Summary” that the nursing staff will review with you prior to discharge.  You may have some home medications which will be placed on hold until you complete the course of blood thinner medication.  It is important for you to complete the blood thinner medication as prescribed by your surgeon.  Continue your approved medications as instructed at time of discharge. ° °PRECAUTIONS °If  you experience chest pain or shortness of breath - call 911 immediately for transfer to the hospital emergency department.  °If you develop a fever greater that 101 F, purulent drainage from wound, increased redness or drainage from wound, foul odor from the wound/dressing, or calf pain - CONTACT YOUR SURGEON.   °                                                °FOLLOW-UP APPOINTMENTS °Make sure you keep all of your appointments after your operation with your surgeon and caregivers. You should call the office at the above phone number and make an appointment for approximately two weeks after the date of your surgery or on the date instructed by your surgeon outlined in the "After Visit Summary". ° ° °RANGE OF MOTION AND STRENGTHENING EXERCISES  °Rehabilitation of the knee is important following a knee injury or   an operation. After just a few days of immobilization, the muscles of the thigh which control the knee become weakened and shrink (atrophy). Knee exercises are designed to build up the tone and strength of the thigh muscles and to improve knee motion. Often times heat used for twenty to thirty minutes before working out will loosen up your tissues and help with improving the range of motion but do not use heat for the first two weeks following surgery. These exercises can be done on a training (exercise) mat, on the floor, on a table or on a bed. Use what ever works the best and is most comfortable for you Knee exercises include:  °• Leg Lifts - While your knee is still immobilized in a splint or cast, you can do straight leg raises. Lift the leg to 60 degrees, hold for 3 sec, and slowly lower the leg. Repeat 10-20 times 2-3 times daily. Perform this exercise against resistance later as your knee gets better.  °• Quad and Hamstring Sets - Tighten up the muscle on the front of the thigh (Quad) and hold for 5-10 sec. Repeat this 10-20 times hourly. Hamstring sets are done by pushing the foot backward against an  object and holding for 5-10 sec. Repeat as with quad sets.  °· Leg Slides: Lying on your back, slowly slide your foot toward your buttocks, bending your knee up off the floor (only go as far as is comfortable). Then slowly slide your foot back down until your leg is flat on the floor again. °· Angel Wings: Lying on your back spread your legs to the side as far apart as you can without causing discomfort.  °A rehabilitation program following serious knee injuries can speed recovery and prevent re-injury in the future due to weakened muscles. Contact your doctor or a physical therapist for more information on knee rehabilitation.  ° °IF YOU ARE TRANSFERRED TO A SKILLED REHAB FACILITY °If the patient is transferred to a skilled rehab facility following release from the hospital, a list of the current medications will be sent to the facility for the patient to continue.  When discharged from the skilled rehab facility, please have the facility set up the patient's Home Health Physical Therapy prior to being released. Also, the skilled facility will be responsible for providing the patient with their medications at time of release from the facility to include their pain medication, the muscle relaxants, and their blood thinner medication. If the patient is still at the rehab facility at time of the two week follow up appointment, the skilled rehab facility will also need to assist the patient in arranging follow up appointment in our office and any transportation needs. ° °MAKE SURE YOU:  °• Understand these instructions.  °• Get help right away if you are not doing well or get worse.  ° ° °Pick up stool softner and laxative for home use following surgery while on pain medications. °Do not submerge incision under water. °Please use good hand washing techniques while changing dressing each day. °May shower starting three days after surgery. °Please use a clean towel to pat the incision dry following showers. °Continue to  use ice for pain and swelling after surgery. °Do not use any lotions or creams on the incision until instructed by your surgeon. ° °

## 2017-07-05 NOTE — Transfer of Care (Signed)
Immediate Anesthesia Transfer of Care Note  Patient: Janet Diaz  Procedure(s) Performed: RIGHT TOTAL KNEE ARTHROPLASTY (Right Knee)  Patient Location: PACU  Anesthesia Type:Regional and Spinal  Level of Consciousness: sedated  Airway & Oxygen Therapy: Patient Spontanous Breathing and Patient connected to face mask oxygen  Post-op Assessment: Report given to RN and Post -op Vital signs reviewed and stable  Post vital signs: Reviewed and stable  Last Vitals:  Vitals Value Taken Time  BP    Temp    Pulse 68 07/05/2017  1:56 PM  Resp 10 07/05/2017  1:56 PM  SpO2 99 % 07/05/2017  1:56 PM  Vitals shown include unvalidated device data.  Last Pain:  Vitals:   07/05/17 1201  TempSrc:   PainSc: 0-No pain      Patients Stated Pain Goal: 3 (14/44/58 4835)  Complications: No apparent anesthesia complications

## 2017-07-05 NOTE — Interval H&P Note (Signed)
History and Physical Interval Note:  02/25/863 78:46 AM  Janet Diaz  has presented today for surgery, with the diagnosis of Osteoarthritis Right Knee  The various methods of treatment have been discussed with the patient and family. After consideration of risks, benefits and other options for treatment, the patient has consented to  Procedure(s): RIGHT TOTAL KNEE ARTHROPLASTY (Right) as a surgical intervention .  The patient's history has been reviewed, patient examined, no change in status, stable for surgery.  I have reviewed the patient's chart and labs.  Questions were answered to the patient's satisfaction.     Pilar Plate Janet Diaz

## 2017-07-05 NOTE — Anesthesia Preprocedure Evaluation (Addendum)
Anesthesia Evaluation  Patient identified by MRN, date of birth, ID band Patient awake    Reviewed: Allergy & Precautions, NPO status , Patient's Chart, lab work & pertinent test results  Airway Mallampati: II  TM Distance: >3 FB Neck ROM: Full    Dental no notable dental hx.    Pulmonary neg pulmonary ROS,    Pulmonary exam normal breath sounds clear to auscultation       Cardiovascular negative cardio ROS Normal cardiovascular exam Rhythm:Regular Rate:Normal  ECG: NSR, rate 62. LBBB  ECHO: Normal EF Mild TR  Pre-op eval per cardiology   Neuro/Psych Anxiety negative neurological ROS     GI/Hepatic Neg liver ROS, GERD  Medicated and Controlled,  Endo/Other  negative endocrine ROS  Renal/GU negative Renal ROS     Musculoskeletal  (+) Arthritis , Osteoarthritis,    Abdominal (+) + obese,   Peds  Hematology HLD   Anesthesia Other Findings   Reproductive/Obstetrics                            Anesthesia Physical Anesthesia Plan  ASA: II  Anesthesia Plan: Spinal and Regional   Post-op Pain Management:    Induction: Intravenous  PONV Risk Score and Plan: 2 and Propofol infusion, Treatment may vary due to age or medical condition and Ondansetron  Airway Management Planned: Natural Airway  Additional Equipment:   Intra-op Plan:   Post-operative Plan:   Informed Consent: I have reviewed the patients History and Physical, chart, labs and discussed the procedure including the risks, benefits and alternatives for the proposed anesthesia with the patient or authorized representative who has indicated his/her understanding and acceptance.   Dental advisory given  Plan Discussed with: CRNA  Anesthesia Plan Comments:         Anesthesia Quick Evaluation

## 2017-07-05 NOTE — Progress Notes (Signed)
AssistedDr. Ellender with right, ultrasound guided, adductor canal block. Side rails up, monitors on throughout procedure. See vital signs in flow sheet. Tolerated Procedure well.  

## 2017-07-05 NOTE — Anesthesia Procedure Notes (Signed)
Spinal  Patient location during procedure: OR Start time: 07/05/2017 12:13 PM End time: 07/05/2017 12:16 PM Staffing Resident/CRNA: Lind Covert, CRNA Performed: resident/CRNA  Preanesthetic Checklist Completed: patient identified, site marked, surgical consent, pre-op evaluation, timeout performed, IV checked, risks and benefits discussed and monitors and equipment checked Spinal Block Patient position: sitting Prep: DuraPrep Patient monitoring: heart rate, cardiac monitor, continuous pulse ox and blood pressure Approach: midline Location: L3-4 Injection technique: single-shot Needle Needle type: Pencan  Needle gauge: 24 G Needle length: 10 cm Needle insertion depth: 6 cm Assessment Sensory level: T6 Additional Notes Timeout performed. SAB kit date checked. SAB without difficulty.

## 2017-07-05 NOTE — Anesthesia Procedure Notes (Signed)
Anesthesia Regional Block: Adductor canal block   Pre-Anesthetic Checklist: ,, timeout performed, Correct Patient, Correct Site, Correct Laterality, Correct Procedure,, site marked, risks and benefits discussed, Surgical consent,  Pre-op evaluation,  At surgeon's request and post-op pain management  Laterality: Right  Prep: chloraprep       Needles:  Injection technique: Single-shot  Needle Type: Echogenic Stimulator Needle     Needle Length: 10cm  Needle Gauge: 21     Additional Needles:   Procedures:,,,, ultrasound used (permanent image in chart),,,,  Narrative:  Start time: 07/05/2017 11:50 AM End time: 07/05/2017 12:00 PM Injection made incrementally with aspirations every 5 mL.  Performed by: Personally  Anesthesiologist: Murvin Natal, MD  Additional Notes: Functioning IV was confirmed and monitors were applied.  A 119mm 21ga Pajunk echogenic stimulator needle was used. Sterile prep, hand hygiene and sterile gloves were used.  Negative aspiration and negative test dose prior to incremental administration of local anesthetic. The patient tolerated the procedure well.

## 2017-07-05 NOTE — Op Note (Signed)
OPERATIVE REPORT-TOTAL KNEE ARTHROPLASTY   Pre-operative diagnosis- Osteoarthritis  Right knee(s)  Post-operative diagnosis- Osteoarthritis Right knee(s)  Procedure-  Right  Total Knee Arthroplasty  Surgeon- Dione Plover. Liyana Suniga, MD  Assistant- Theresa Duty, PA-C   Anesthesia-  Adductor canal block and spinal  EBL-30 mL   Drains Hemovac  Tourniquet time-  Total Tourniquet Time Documented: Thigh (Right) - 40 minutes Total: Thigh (Right) - 40 minutes     Complications- None  Condition-PACU - hemodynamically stable.   Brief Clinical Note  Janet Diaz is a 72 y.o. year old female with end stage OA of her right knee with progressively worsening pain and dysfunction. She has constant pain, with activity and at rest and significant functional deficits with difficulties even with ADLs. She has had extensive non-op management including analgesics, injections of cortisone and viscosupplements, and home exercise program, but remains in significant pain with significant dysfunction.Radiographs show bone on bone arthritis medial and patellofemoral. She presents now for right Total Knee Arthroplasty.    Procedure in detail---   The patient is brought into the operating room and positioned supine on the operating table. After successful administration of  Adductor canal block and spinal,   a tourniquet is placed high on the  Right thigh(s) and the lower extremity is prepped and draped in the usual sterile fashion. Time out is performed by the operating team and then the  Right lower extremity is wrapped in Esmarch, knee flexed and the tourniquet inflated to 300 mmHg.       A midline incision is made with a ten blade through the subcutaneous tissue to the level of the extensor mechanism. A fresh blade is used to make a medial parapatellar arthrotomy. Soft tissue over the proximal medial tibia is subperiosteally elevated to the joint line with a knife and into the semimembranosus bursa with a  Cobb elevator. Soft tissue over the proximal lateral tibia is elevated with attention being paid to avoiding the patellar tendon on the tibial tubercle. The patella is everted, knee flexed 90 degrees and the ACL and PCL are removed. Findings are bone on bone medial and patellofemoral with large global osteophytes.        The drill is used to create a starting hole in the distal femur and the canal is thoroughly irrigated with sterile saline to remove the fatty contents. The 5 degree Right  valgus alignment guide is placed into the femoral canal and the distal femoral cutting block is pinned to remove 10 mm off the distal femur. Resection is made with an oscillating saw.      The tibia is subluxed forward and the menisci are removed. The extramedullary alignment guide is placed referencing proximally at the medial aspect of the tibial tubercle and distally along the second metatarsal axis and tibial crest. The block is pinned to remove 57mm off the more deficient medial  side. Resection is made with an oscillating saw. Size 2.5is the most appropriate size for the tibia and the proximal tibia is prepared with the modular drill and keel punch for that size.      The femoral sizing guide is placed and size 2.5 is most appropriate. Rotation is marked off the epicondylar axis and confirmed by creating a rectangular flexion gap at 90 degrees. The size 2.5 cutting block is pinned in this rotation and the anterior, posterior and chamfer cuts are made with the oscillating saw. The intercondylar block is then placed and that cut is made.  Trial size 2.5 tibial component, trial size 2.5 posterior stabilized femur and a 12.5  mm posterior stabilized rotating platform insert trial is placed. Full extension is achieved with excellent varus/valgus and anterior/posterior balance throughout full range of motion. The patella is everted and thickness measured to be 22  mm. Free hand resection is taken to 12 mm, a 35 template is  placed, lug holes are drilled, trial patella is placed, and it tracks normally. Osteophytes are removed off the posterior femur with the trial in place. All trials are removed and the cut bone surfaces prepared with pulsatile lavage. Cement is mixed and once ready for implantation, the size 2.5 tibial implant, size  2.5 posterior stabilized femoral component, and the size 35 patella are cemented in place and the patella is held with the clamp. The trial insert is placed and the knee held in full extension. The Exparel (20 ml mixed with 60 ml saline) is injected into the extensor mechanism, posterior capsule, medial and lateral gutters and subcutaneous tissues.  All extruded cement is removed and once the cement is hard the permanent 12.5 mm posterior stabilized rotating platform insert is placed into the tibial tray.      The wound is copiously irrigated with saline solution and the extensor mechanism closed over a hemovac drain with #1 V-loc suture. The tourniquet is released for a total tourniquet time of 40  minutes. Flexion against gravity is 140 degrees and the patella tracks normally. Subcutaneous tissue is closed with 2.0 vicryl and subcuticular with running 4.0 Monocryl. The incision is cleaned and dried and steri-strips and a bulky sterile dressing are applied. The limb is placed into a knee immobilizer and the patient is awakened and transported to recovery in stable condition.      Please note that a surgical assistant was a medical necessity for this procedure in order to perform it in a safe and expeditious manner. Surgical assistant was necessary to retract the ligaments and vital neurovascular structures to prevent injury to them and also necessary for proper positioning of the limb to allow for anatomic placement of the prosthesis.   Dione Plover Tahjanae Blankenburg, MD    07/05/2017, 1:19 PM

## 2017-07-06 ENCOUNTER — Encounter (HOSPITAL_COMMUNITY): Payer: Self-pay | Admitting: Orthopedic Surgery

## 2017-07-06 LAB — BASIC METABOLIC PANEL
ANION GAP: 7 (ref 5–15)
BUN: 18 mg/dL (ref 8–23)
CALCIUM: 8.6 mg/dL — AB (ref 8.9–10.3)
CHLORIDE: 105 mmol/L (ref 98–111)
CO2: 26 mmol/L (ref 22–32)
Creatinine, Ser: 0.9 mg/dL (ref 0.44–1.00)
GFR calc Af Amer: >60 mL/min (ref >60)
GFR calc non Af Amer: >60 mL/min (ref >60)
GLUCOSE: 177 mg/dL — AB (ref 70–99)
Potassium: 4.2 mmol/L (ref 3.5–5.1)
Sodium: 138 mmol/L (ref 135–145)

## 2017-07-06 LAB — CBC
HEMATOCRIT: 37.8 % (ref 36.0–46.0)
Hemoglobin: 12 g/dL (ref 12.0–15.0)
MCH: 31 pg (ref 26.0–34.0)
MCHC: 31.7 g/dL (ref 30.0–36.0)
MCV: 97.7 fL (ref 78.0–100.0)
Platelets: 194 10*3/uL (ref 150–400)
RBC: 3.87 MIL/uL (ref 3.87–5.11)
RDW: 13.6 % (ref 11.5–15.5)
WBC: 11.4 10*3/uL — AB (ref 4.0–10.5)

## 2017-07-06 MED ORDER — MECLIZINE HCL 12.5 MG PO TABS: 12.5000 mg | ORAL_TABLET | Freq: Two times a day (BID) | ORAL | 0 refills | Status: DC | PRN

## 2017-07-06 NOTE — Care Management Note (Signed)
Case Management Note  Patient Details  Name: Janet Diaz MRN: [id number] Date of Birth: 1945-10-28  Subjective/Objective: PT-recc per surgeons recc-otpt PT,& f/u therapy. PT recc-rw,3n1. [facility] dme rep Santiago Glad aware of orders, to deliver dme to rm prior d/c. No further CM needs.                  Action/Plan:d/c home/dme.   Expected Discharge Date:                  Expected Discharge Plan:  OP Rehab  In-House Referral:     Discharge planning Services  CM Consult  Post Acute Care Choice:    Choice offered to:  Patient  DME Arranged:  3-N-1, Walker rolling DME Agency:  [place]:    [place]:     Status of Service:  Completed, signed off  If discussed at [place] of Stay Meetings, dates discussed:    Additional Comments:  Dessa Phi, RN 07/06/2017, 2:15 PM

## 2017-07-06 NOTE — Progress Notes (Addendum)
Physical Therapy Treatment Patient Details Name: Janet Diaz MRN: [id number] DOB: 1945/08/22 Today's Date: 07/06/2017    History of Present Illness R TKA    PT Comments    Pt ambulated increased distance of 120' with RW, distance limited by pain. Pt reported she was in too much pain to perform TKA exercises. Ice applied to knee.  Follow Up Recommendations  Outpatient PT;Follow surgeon's recommendation for DC plan and follow-up therapies(outpt PT on Friday in [place])     Equipment Recommendations  Rolling walker with 5" wheels; 3 in 1   Recommendations for Other Services       Precautions / Restrictions Precautions Precautions: Knee Precaution Booklet Issued: Yes (comment) Precaution Comments: instructed pt in no pillow under knee Restrictions Weight Bearing Restrictions: No Other Position/Activity Restrictions: WBAT    Mobility  Bed Mobility Overal bed mobility: Needs Assistance Bed Mobility: Sit to Supine       Sit to supine: Min assist   General bed mobility comments: min A for LEs into bed  Transfers Overall transfer level: Needs assistance Equipment used: Rolling walker (2 wheeled) Transfers: Sit to/from Stand Sit to Stand: Supervision         General transfer comment: VCs hand placement,  Ambulation/Gait Ambulation/Gait assistance: Min guard Gait Distance (Feet): 120 Feet Assistive device: Rolling walker (2 wheeled) Gait Pattern/deviations: Step-to pattern;Decreased step length - right;Decreased step length - left Gait velocity: decr   General Gait Details: VCs sequencing and positioning in RW, no loss of balance   Stairs             Wheelchair Mobility    Modified Rankin (Stroke Patients Only)       Balance Overall balance assessment: Modified Independent                                          Cognition Arousal/Alertness: Awake/alert Behavior During Therapy: WFL for tasks assessed/performed Overall  Cognitive Status: Within Functional Limits for tasks assessed                                        Exercises Total Joint Exercises Ankle Circles/Pumps: AROM;Both;20 reps;Supine  Goniometric ROM: 5-45* R knee AAROM    General Comments        Pertinent Vitals/Pain Pain Assessment: 0-10 Pain Score: 7  Pain Location: R knee with activity Pain Descriptors / Indicators: Sore Pain Intervention(s): Limited activity within patient's tolerance;Monitored during session;Premedicated before session;Ice applied    Home Living Family/patient expects to be discharged to:: Private residence Living Arrangements: Alone Available Help at Discharge: Friend(s);Family;Available 24 hours/day   Home Access: Stairs to enter   Home Layout: Laundry or work area in [place]: Bedside commode Additional Comments: plans to have great grandaughter and other friends stay with her 24/7    Prior Function Level of Independence: Independent          PT Goals (current goals can now be found in the care plan section) Acute Rehab 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 : A Little Difficulty sitting down on and standing up from a chair with arms (e.g., wheelchair, bedside commode, etc,.)?: A Little Help needed moving to and from a bed to chair (including a wheelchair)?: A Little Help needed walking in hospital room?: A Little Help needed climbing 3-5 steps with a railing? : A  Lot 6 Click Score: 17    End of Session Equipment Utilized During Treatment: Gait belt Activity Tolerance: Patient limited by pain;Patient tolerated treatment well Patient left: with call bell/phone within reach;in bed Nurse Communication: Mobility status PT Visit Diagnosis: Muscle weakness (generalized) (M62.81);Difficulty in walking, not elsewhere classified (R26.2);Pain Pain - Right/Left: Right Pain - part of body: Knee     Time: 1340-1403 PT Time Calculation (min) (ACUTE ONLY): 23 min  Charges:  $Gait Training: 8-22 mins $Therapeutic Exercise: 8-22 mins $Therapeutic Activity: 8-22 mins                    G Codes:          [name] 07/06/2017, 2:07 PM [phone]

## 2017-07-06 NOTE — Progress Notes (Signed)
   Subjective: 1 Day Post-Op Procedure(s) (LRB): RIGHT TOTAL KNEE ARTHROPLASTY (Right) Patient reports pain as mild.   Patient seen in rounds by Dr. Wynelle Link. Patient is well, and has had no acute complaints or problems other than pain in the right knee. Foley removed this AM. Denies chest pain, SOB, or calf pain. We will start therapy today.   Objective: Vital signs in last 24 hours: Temp:  [97.4 F (36.3 C)-98.4 F (36.9 C)] 98.4 F (36.9 C) (06/25 0507) Pulse Rate:  [54-76] 59 (06/25 0507) Resp:  [7-20] 17 (06/25 0507) BP: (101-135)/(48-83) 111/56 (06/25 0507) SpO2:  [96 %-100 %] 98 % (06/25 0507) Weight:  [86.2 kg (190 lb)] 86.2 kg (190 lb) (06/24 0905)  Intake/Output from previous day:  Intake/Output Summary (Last 24 hours) at 07/06/2017 0738 Last data filed at 07/06/2017 2440 Gross per 24 hour  Intake 3707.5 ml  Output 2585 ml  Net 1122.5 ml    Labs: Recent Labs    07/06/17 0547  HGB 12.0   Recent Labs    07/06/17 0547  WBC 11.4*  RBC 3.87  HCT 37.8  PLT 194   Recent Labs    07/06/17 0547  NA 138  K 4.2  CL 105  CO2 26  BUN 18  CREATININE 0.90  GLUCOSE 177*  CALCIUM 8.6*   Exam: General - Patient is Alert and Oriented Extremity - Neurologically intact Neurovascular intact Sensation intact distally Dorsiflexion/Plantar flexion intact Dressing - dressing C/D/I Motor Function - intact, moving foot and toes well on exam.   Past Medical History:  Diagnosis Date  . Acute bronchitis   . Allergic rhinosinusitis   . Anxiety and depression   . Candida infection of flexural skin   . Carpal tunnel syndrome   . Complication of anesthesia    hard time waking up , And BP went upwith a finger surgery,  . Dyslipidemia   . GERD (gastroesophageal reflux disease)   . Laryngitis   . OAB (overactive bladder)   . Osteoarthritis of right knee   . Pedal edema   . Vitamin B 12 deficiency   . Vitamin D deficiency     Assessment/Plan: 1 Day Post-Op  Procedure(s) (LRB): RIGHT TOTAL KNEE ARTHROPLASTY (Right) Principal Problem:   Osteoarthritis of knee Active Problems:   OA (osteoarthritis) of knee  Estimated body mass index is 35.9 kg/m as calculated from the following:   Height as of this encounter: 5\' 1"  (1.549 m).   Weight as of this encounter: 86.2 kg (190 lb). Advance diet Up with therapy  Anticipated LOS equal to or greater than 2 midnights due to - Age 74 and older with one or more of the following:  - Obesity  - Expected need for hospital services (PT, OT, Nursing) required for safe  discharge  - Anticipated need for postoperative skilled nursing care or inpatient rehab  - Active co-morbidities: None OR   - Unanticipated findings during/Post Surgery: None  - Patient is a high risk of re-admission due to: None    DVT Prophylaxis - Aspirin Weight bearing as tolerated. D/C O2 and pulse ox and try on room air. Hemovac pulled without difficulty, will begin therapy today.  Plan is to go Home after hospital stay. Possible discharge tomorrow if progresses with therapy and meeting goals.  Theresa Duty, PA-C Orthopedic Surgery 07/06/2017, 7:38 AM

## 2017-07-06 NOTE — Plan of Care (Signed)
  Problem: Education: Goal: Knowledge of General Education information will improve Outcome: Progressing   Problem: Activity: Goal: Risk for activity intolerance will decrease Outcome: Progressing   Problem: Nutrition: Goal: Adequate nutrition will be maintained Outcome: Progressing   Problem: Coping: Goal: Level of anxiety will decrease Outcome: Progressing   Problem: Safety: Goal: Ability to remain free from injury will improve Outcome: Progressing   Problem: Skin Integrity: Goal: Risk for impaired skin integrity will decrease Outcome: Progressing

## 2017-07-06 NOTE — Evaluation (Signed)
Physical Therapy Evaluation Patient Details Name: Janet Diaz MRN: [id number] DOB: January 26, 1945 Today's Date: 07/06/2017   History of Present Illness  R TKA  Clinical Impression  Pt is s/p TKA resulting in the deficits listed below (see PT Problem List). PT ambulated 18' with RW, no loss of balance. Initiated TKA HEP, good progress expected. Pt puts forth good effort.  Pt will benefit from skilled PT to increase their independence and safety with mobility to allow discharge to the venue listed below.      Follow Up Recommendations Outpatient PT;Follow surgeon's recommendation for DC plan and follow-up therapies(outpt PT on Friday in [place])    Equipment Recommendations  Rolling walker with 5" wheels    Recommendations for Other Services       Precautions / Restrictions Precautions Precautions: Knee Precaution Booklet Issued: Yes (comment) Precaution Comments: instructed pt in no pillow under knee Restrictions Weight Bearing Restrictions: No Other Position/Activity Restrictions: WBAT      Mobility  Bed Mobility               General bed mobility comments: up on edge of bed  Transfers Overall transfer level: Needs assistance Equipment used: Rolling walker (2 wheeled) Transfers: Sit to/from Stand Sit to Stand: Min assist         General transfer comment: VCs hand placement, min A to rise  Ambulation/Gait Ambulation/Gait assistance: Min guard Gait Distance (Feet): 50 Feet Assistive device: Rolling walker (2 wheeled) Gait Pattern/deviations: Step-to pattern;Decreased step length - right;Decreased step length - left Gait velocity: decr   General Gait Details: VCs sequencing and positioning in RW, no loss of balance  Stairs            Wheelchair Mobility    Modified Rankin (Stroke Patients Only)       Balance Overall balance assessment: Modified Independent                                           Pertinent Vitals/Pain  Pain Assessment: 0-10 Pain Score: 6  Pain Location: R knee with activity Pain Descriptors / Indicators: Sore Pain Intervention(s): Limited activity within patient's tolerance;Monitored during session;Premedicated before session;Ice applied    Home Living Family/patient expects to be discharged to:: Private residence Living Arrangements: Alone Available Help at Discharge: Friend(s);Family;Available 24 hours/day   Home Access: Stairs to enter   Entrance Stairs-Number of Steps: 1 Home Layout: Laundry or work area in [place]: Bedside commode Additional Comments: plans to have great grandaughter and other friends stay with her 24/7    Prior Function Level of Independence: Independent               Hand Dominance        Extremity/Trunk Assessment   Upper Extremity Assessment Upper Extremity Assessment: Defer to OT evaluation    Lower Extremity Assessment Lower Extremity Assessment: RLE deficits/detail RLE Deficits / Details: SLR -3/5, knee AAROM 5-40* RLE Sensation: WNL    Cervical / Trunk Assessment Cervical / Trunk Assessment: Normal  Communication   Communication: No difficulties  Cognition Arousal/Alertness: Awake/alert Behavior During Therapy: WFL for tasks assessed/performed Overall Cognitive Status: Within Functional Limits for tasks assessed  General Comments      Exercises Total Joint Exercises Ankle Circles/Pumps: AROM;Both;20 reps;Supine Quad Sets: AROM;Both;10 reps;Supine Short Arc Quad: AROM;Right;10 reps;Supine Heel Slides: AAROM;Right;10 reps;Supine Hip ABduction/ADduction: AAROM;Right;10 reps;Supine Straight Leg Raises: AAROM;Right;10 reps;Supine Long Arc Quad: AAROM;Right;5 reps;Seated Knee Flexion: AAROM;AROM;Right;5 reps;Seated Goniometric ROM: 5-45* R knee AAROM   Assessment/Plan    PT Assessment Patient needs continued PT services  PT Problem List Decreased  strength;Decreased range of motion;Decreased balance;Decreased mobility;Pain       PT Treatment Interventions DME instruction;Gait training;Stair training;Therapeutic exercise;Therapeutic activities;Patient/family education    PT Goals (Current goals can be found in the Care Plan section)  Acute Rehab 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 : A Little Difficulty sitting down on and standing up from a chair with arms (e.g., wheelchair, bedside commode, etc,.)?: Unable Help needed moving to and from a bed to chair (including a wheelchair)?: A Little Help needed walking in hospital room?: A Little Help needed climbing 3-5 steps with a railing? : A Lot 6 Click Score: 15    End of Session Equipment Utilized During Treatment: Gait belt Activity Tolerance: Patient limited by pain;Patient tolerated treatment well Patient left: in chair;with call bell/phone within reach Nurse Communication: Mobility status PT Visit Diagnosis: Muscle weakness (generalized) (M62.81);Difficulty in walking, not elsewhere classified (R26.2);Pain Pain - Right/Left: Right Pain - part of body: Knee    Time: 7903-8333 PT Time Calculation (min) (ACUTE ONLY): 38 min   Charges:   PT Evaluation $PT Eval Low Complexity: 1 Low PT Treatments $Gait Training: 8-22 mins $Therapeutic Exercise: 8-22 mins   PT G Codes:          [name] 07/06/2017, 11:33 AM [phone]

## 2017-07-07 LAB — CBC
HCT: 37.8 % (ref 36.0–46.0)
Hemoglobin: 11.9 g/dL — ABNORMAL LOW (ref 12.0–15.0)
MCH: 30.7 pg (ref 26.0–34.0)
MCHC: 31.5 g/dL (ref 30.0–36.0)
MCV: 97.7 fL (ref 78.0–100.0)
PLATELETS: 208 10*3/uL (ref 150–400)
RBC: 3.87 MIL/uL (ref 3.87–5.11)
RDW: 13.7 % (ref 11.5–15.5)
WBC: 12.2 10*3/uL — ABNORMAL HIGH (ref 4.0–10.5)

## 2017-07-07 LAB — BASIC METABOLIC PANEL
Anion gap: 8 (ref 5–15)
BUN: 20 mg/dL (ref 8–23)
CO2: 32 mmol/L (ref 22–32)
Calcium: 8.9 mg/dL (ref 8.9–10.3)
Chloride: 102 mmol/L (ref 98–111)
Creatinine, Ser: 0.97 mg/dL (ref 0.44–1.00)
GFR calc Af Amer: >60 mL/min (ref >60)
GFR calc non Af Amer: 57 mL/min — ABNORMAL LOW (ref >60)
GLUCOSE: 102 mg/dL — AB (ref 70–99)
Potassium: 4.3 mmol/L (ref 3.5–5.1)
SODIUM: 142 mmol/L (ref 135–145)

## 2017-07-07 MED ORDER — ASPIRIN 325 MG PO TBEC: 325.0000 mg | DELAYED_RELEASE_TABLET | Freq: Two times a day (BID) | ORAL | 0 refills | Status: DC

## 2017-07-07 MED ORDER — OXYCODONE HCL 5 MG PO TABS: 5.0000 mg | ORAL_TABLET | Freq: Four times a day (QID) | ORAL | 0 refills | Status: DC | PRN

## 2017-07-07 MED ORDER — METHOCARBAMOL 500 MG PO TABS: 500.0000 mg | ORAL_TABLET | Freq: Four times a day (QID) | ORAL | 0 refills | Status: DC | PRN

## 2017-07-07 NOTE — Progress Notes (Signed)
   Subjective: 2 Days Post-Op Procedure(s) (LRB): RIGHT TOTAL KNEE ARTHROPLASTY (Right) Patient reports pain as mild.   Patient seen in rounds by Dr. Wynelle Link. Patient is well, and has had no acute complaints or problems. Pt states she is ready to go home. Voiding without difficulty and positive flatus. Denies chest pain, SOB, or calf pain. Plan is to go Home after hospital stay.  Objective: Vital signs in last 24 hours: Temp:  [98.2 F (36.8 C)-98.8 F (37.1 C)] 98.3 F (36.8 C) (06/26 0624) Pulse Rate:  [55-57] 55 (06/26 0624) Resp:  [14-16] 16 (06/26 0624) BP: (119-131)/(58-62) 122/62 (06/26 0624) SpO2:  [92 %-99 %] 97 % (06/26 0624)  Intake/Output from previous day:  Intake/Output Summary (Last 24 hours) at 07/07/2017 0741 Last data filed at 07/07/2017 6553 Gross per 24 hour  Intake 1458.75 ml  Output 1501 ml  Net -42.25 ml    Labs: Recent Labs    07/06/17 0547 07/07/17 0549  HGB 12.0 11.9*   Recent Labs    07/06/17 0547 07/07/17 0549  WBC 11.4* 12.2*  RBC 3.87 3.87  HCT 37.8 37.8  PLT 194 208   Recent Labs    07/06/17 0547 07/07/17 0549  NA 138 142  K 4.2 4.3  CL 105 102  CO2 26 32  BUN 18 20  CREATININE 0.90 0.97  GLUCOSE 177* 102*  CALCIUM 8.6* 8.9    Exam: General - Patient is Alert and Oriented Extremity - Neurologically intact Neurovascular intact Sensation intact distally Dorsiflexion/Plantar flexion intact Dressing/Incision - clean, dry, no drainage Motor Function - intact, moving foot and toes well on exam.   Past Medical History:  Diagnosis Date  . Acute bronchitis   . Allergic rhinosinusitis   . Anxiety and depression   . Candida infection of flexural skin   . Carpal tunnel syndrome   . Complication of anesthesia    hard time waking up , And BP went upwith a finger surgery,  . Dyslipidemia   . GERD (gastroesophageal reflux disease)   . Laryngitis   . OAB (overactive bladder)   . Osteoarthritis of right knee   . Pedal edema    . Vitamin B 12 deficiency   . Vitamin D deficiency     Assessment/Plan: 2 Days Post-Op Procedure(s) (LRB): RIGHT TOTAL KNEE ARTHROPLASTY (Right) Principal Problem:   Osteoarthritis of knee Active Problems:   OA (osteoarthritis) of knee  Estimated body mass index is 35.9 kg/m as calculated from the following:   Height as of this encounter: 5\' 1"  (1.549 m).   Weight as of this encounter: 86.2 kg (190 lb). Up with therapy D/C IV fluids  DVT Prophylaxis - Aspirin Weight-bearing as tolerated  Plan for discharge to home today with outpatient physical therapy at [facility] if meeting goals and stable. Follow-up in 2 weeks with Dr. Wynelle Link in the office.   Theresa Duty, PA-C Orthopedic Surgery 07/07/2017, 7:41 AM

## 2017-07-07 NOTE — Progress Notes (Signed)
Physical Therapy Treatment Patient Details Name: Janet Diaz MRN: [id number] DOB: 10-03-1945 Today's Date: 07/07/2017    History of Present Illness R TKA    PT Comments    Stair training completed, reviewed HEP, pt ambulated 220' with RW without loss of balance. She has PT goals and is ready to DC home from PT standpoint.  Follow Up Recommendations  Outpatient PT;Follow surgeon's recommendation for DC plan and follow-up therapies(outpt PT on Friday in [place])     Equipment Recommendations  Rolling walker with 5" wheels;3in1 (PT)(pt borrowed a 3 in 1 but its too big for her bathroom)    Recommendations for Other Services       Precautions / Restrictions Precautions Precautions: Knee Precaution Booklet Issued: Yes (comment) Precaution Comments: instructed pt in no pillow under knee Restrictions Weight Bearing Restrictions: No Other Position/Activity Restrictions: WBAT    Mobility  Bed Mobility Overal bed mobility: Modified Independent Bed Mobility: Supine to Sit     Supine to sit: Modified independent (Device/Increase time);HOB elevated        Transfers Overall transfer level: Modified independent Equipment used: Rolling walker (2 wheeled) Transfers: Sit to/from Stand Sit to Stand: Modified independent (Device/Increase time)         General transfer comment: good hand placement,  Ambulation/Gait Ambulation/Gait assistance: Modified independent (Device/Increase time) Gait Distance (Feet): 220 Feet Assistive device: Rolling walker (2 wheeled) Gait Pattern/deviations: Step-to pattern;Decreased step length - right;Decreased step length - left Gait velocity: decr   General Gait Details: good sequencing, no loss of balance   Stairs Stairs: Yes Stairs assistance: Supervision Stair Management: No rails;Backwards Number of Stairs: 1 General stair comments: ascended 1 step x 2 trials, VCs sequencing, no loss of balance   Wheelchair Mobility     Modified Rankin (Stroke Patients Only)       Balance Overall balance assessment: Modified Independent                                          Cognition Arousal/Alertness: Awake/alert Behavior During Therapy: WFL for tasks assessed/performed Overall Cognitive Status: Within Functional Limits for tasks assessed                                        Exercises Total Joint Exercises Ankle Circles/Pumps: AROM;Both;20 reps;Supine Quad Sets: AROM;Both;10 reps;Supine Short Arc Quad: AROM;Right;10 reps;Supine Heel Slides: AAROM;Right;10 reps;Supine Hip ABduction/ADduction: AAROM;Right;10 reps;Supine Straight Leg Raises: AAROM;Right;10 reps;Supine Long Arc Quad: AAROM;Right;5 reps;Seated Knee Flexion: AAROM;AROM;Right;5 reps;Seated Goniometric ROM: 5-55* AAROM R knee    General Comments        Pertinent Vitals/Pain Pain Score: 7  Pain Location: R knee with activity Pain Descriptors / Indicators: Sore Pain Intervention(s): Limited activity within patient's tolerance;Monitored during session;Premedicated before session;Ice applied    Home Living                      Prior Function            PT Goals (current goals can now be found in the care plan section) Acute Rehab 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 : A Little Difficulty sitting down on and standing up from a chair with arms (e.g.,  wheelchair, bedside commode, etc,.)?: A Little Help needed moving to and from a bed to chair (including a wheelchair)?: A Little Help needed walking in hospital room?: None Help needed climbing 3-5 steps with a railing? : A Little 6 Click Score: 20    End of Session Equipment Utilized During Treatment: Gait belt Activity Tolerance: Patient tolerated treatment well Patient left: with call bell/phone within reach;in chair;with family/visitor present Nurse Communication: Mobility status PT Visit Diagnosis: Muscle weakness (generalized) (M62.81);Difficulty in walking, not elsewhere classified (R26.2);Pain Pain - Right/Left: Right Pain - part of body: Knee     Time: 1113-1208 PT Time Calculation (min) (ACUTE ONLY): 55 min  Charges:  $Gait Training: 23-37 mins $Therapeutic Exercise: 8-22 mins $Therapeutic Activity: 8-22 mins                    G Codes:          [name] 07/07/2017, 12:41 PM [phone]

## 2017-07-09 DIAGNOSIS — Z96651 Presence of right artificial knee joint: Secondary | ICD-10-CM | POA: Diagnosis not present

## 2017-07-09 DIAGNOSIS — R262 Difficulty in walking, not elsewhere classified: Secondary | ICD-10-CM | POA: Diagnosis not present

## 2017-07-09 DIAGNOSIS — M25561 Pain in right knee: Secondary | ICD-10-CM | POA: Diagnosis not present

## 2017-07-09 DIAGNOSIS — M62551 Muscle wasting and atrophy, not elsewhere classified, right thigh: Secondary | ICD-10-CM | POA: Diagnosis not present

## 2017-07-12 DIAGNOSIS — M62551 Muscle wasting and atrophy, not elsewhere classified, right thigh: Secondary | ICD-10-CM | POA: Diagnosis not present

## 2017-07-12 DIAGNOSIS — M25561 Pain in right knee: Secondary | ICD-10-CM | POA: Diagnosis not present

## 2017-07-12 DIAGNOSIS — R262 Difficulty in walking, not elsewhere classified: Secondary | ICD-10-CM | POA: Diagnosis not present

## 2017-07-12 DIAGNOSIS — Z96651 Presence of right artificial knee joint: Secondary | ICD-10-CM | POA: Diagnosis not present

## 2017-07-12 NOTE — Discharge Summary (Signed)
Physician Discharge Summary   Patient ID: Janet Diaz MRN: [id number] DOB/AGE: 08/28/1945 72 y.o.  Admit date: 07/05/2017 Discharge date: 07/07/2017  Primary Diagnosis: Osteoarthritis right knee   Admission Diagnoses:  Past Medical History:  Diagnosis Date  . Acute bronchitis   . Allergic rhinosinusitis   . Anxiety and depression   . Candida infection of flexural skin   . Carpal tunnel syndrome   . Complication of anesthesia    hard time waking up , And BP went upwith a finger surgery,  . Dyslipidemia   . GERD (gastroesophageal reflux disease)   . Laryngitis   . OAB (overactive bladder)   . Osteoarthritis of right knee   . Pedal edema   . Vitamin B 12 deficiency   . Vitamin D deficiency    Discharge Diagnoses:   Principal Problem:   Osteoarthritis of knee Active Problems:   OA (osteoarthritis) of knee  Estimated body mass index is 35.9 kg/m as calculated from the following:   Height as of this encounter: 5' 1"  (1.549 m).   Weight as of this encounter: 86.2 kg (190 lb).  Procedure:  Procedure(s) (LRB): RIGHT TOTAL KNEE ARTHROPLASTY (Right)   Consults: None  HPI: Janet Diaz is a 71 y.o. year old female with end stage OA of her right knee with progressively worsening pain and dysfunction. She has constant pain, with activity and at rest and significant functional deficits with difficulties even with ADLs. She has had extensive non-op management including analgesics, injections of cortisone and viscosupplements, and home exercise program, but remains in significant pain with significant dysfunction.Radiographs show bone on bone arthritis medial and patellofemoral. She presents now for right Total Knee Arthroplasty.    Laboratory Data: Admission on 07/05/2017, Discharged on 07/07/2017  Component Date Value Ref Range Status  . WBC 07/06/2017 11.4* 4.0 - 10.5 K/uL Final  . RBC 07/06/2017 3.87  3.87 - 5.11 MIL/uL Final  . Hemoglobin 07/06/2017 12.0  12.0 - 15.0 g/dL  Final  . HCT 07/06/2017 37.8  36.0 - 46.0 % Final  . MCV 07/06/2017 97.7  78.0 - 100.0 fL Final  . MCH 07/06/2017 31.0  26.0 - 34.0 pg Final  . MCHC 07/06/2017 31.7  30.0 - 36.0 g/dL Final  . RDW 07/06/2017 13.6  11.5 - 15.5 % Final  . Platelets 07/06/2017 194  150 - 400 K/uL Final   Performed at [facility], [place] [address], [place], [place] [address]  . Sodium 07/06/2017 138  135 - 145 mmol/L Final  . Potassium 07/06/2017 4.2  3.5 - 5.1 mmol/L Final  . Chloride 07/06/2017 105  98 - 111 mmol/L Final  . CO2 07/06/2017 26  22 - 32 mmol/L Final  . Glucose, Bld 07/06/2017 177* 70 - 99 mg/dL Final  . BUN 07/06/2017 18  8 - 23 mg/dL Final  . Creatinine, Ser 07/06/2017 0.90  0.44 - 1.00 mg/dL Final  . Calcium 07/06/2017 8.6* 8.9 - 10.3 mg/dL Final  . GFR calc non Af Amer 07/06/2017 >60  >60 mL/min Final  . GFR calc Af Amer 07/06/2017 >60  >60 mL/min Final   Comment: (NOTE) The eGFR has been calculated using the CKD EPI equation. This calculation has not been validated in all clinical situations. eGFR's persistently <60 mL/min signify possible Chronic Kidney Disease.   Georgiann Hahn gap 07/06/2017 7  5 - 15 Final   Performed at [facility], [place] [address], [place], [place] [address]  . WBC 07/07/2017  12.2* 4.0 - 10.5 K/uL Final  . RBC 07/07/2017 3.87  3.87 - 5.11 MIL/uL Final  . Hemoglobin 07/07/2017 11.9* 12.0 - 15.0 g/dL Final  . HCT 07/07/2017 37.8  36.0 - 46.0 % Final  . MCV 07/07/2017 97.7  78.0 - 100.0 fL Final  . MCH 07/07/2017 30.7  26.0 - 34.0 pg Final  . MCHC 07/07/2017 31.5  30.0 - 36.0 g/dL Final  . RDW 07/07/2017 13.7  11.5 - 15.5 % Final  . Platelets 07/07/2017 208  150 - 400 K/uL Final   Performed at [facility], [place] [address], [place], [place] [address]  . Sodium 07/07/2017 142  135 - 145 mmol/L Final  . Potassium 07/07/2017 4.3  3.5 - 5.1 mmol/L Final  . Chloride 07/07/2017 102  98 - 111 mmol/L Final   Please note  change in reference range.  . CO2 07/07/2017 32  22 - 32 mmol/L Final  . Glucose, Bld 07/07/2017 102* 70 - 99 mg/dL Final   Please note change in reference range.  . BUN 07/07/2017 20  8 - 23 mg/dL Final   Please note change in reference range.  . Creatinine, Ser 07/07/2017 0.97  0.44 - 1.00 mg/dL Final  . Calcium 07/07/2017 8.9  8.9 - 10.3 mg/dL Final  . GFR calc non Af Amer 07/07/2017 57* >60 mL/min Final  . GFR calc Af Amer 07/07/2017 >60  >60 mL/min Final   Comment: (NOTE) The eGFR has been calculated using the CKD EPI equation. This calculation has not been validated in all clinical situations. eGFR's persistently <60 mL/min signify possible Chronic Kidney Disease.   Georgiann Hahn gap 07/07/2017 8  5 - 15 Final   Performed at [facility], [place] [address], [place], [place] [address]  Hospital Outpatient Visit on 07/01/2017  Component Date Value Ref Range Status  . aPTT 07/01/2017 26  24 - 36 seconds Final   Performed at [facility], [place] [address], [place], [place] [address]  . WBC 07/01/2017 6.0  4.0 - 10.5 K/uL Final  . RBC 07/01/2017 4.36  3.87 - 5.11 MIL/uL Final  . Hemoglobin 07/01/2017 13.6  12.0 - 15.0 g/dL Final  . HCT 07/01/2017 41.9  36.0 - 46.0 % Final  . MCV 07/01/2017 96.1  78.0 - 100.0 fL Final  . MCH 07/01/2017 31.2  26.0 - 34.0 pg Final  . MCHC 07/01/2017 32.5  30.0 - 36.0 g/dL Final  . RDW 07/01/2017 13.4  11.5 - 15.5 % Final  . Platelets 07/01/2017 193  150 - 400 K/uL Final   Performed at [facility], [place] [address], [place],  [address]  . Sodium 07/01/2017 142  135 - 145 mmol/L Final  . Potassium 07/01/2017 4.6  3.5 - 5.1 mmol/L Final  . Chloride 07/01/2017 105  101 - 111 mmol/L Final  . CO2 07/01/2017 30  22 - 32 mmol/L Final  . Glucose, Bld 07/01/2017 105* 65 - 99 mg/dL Final  . BUN 07/01/2017 22* 6 - 20 mg/dL Final  . Creatinine, Ser 07/01/2017 0.92  0.44 - 1.00 mg/dL Final  . Calcium  07/01/2017 9.0  8.9 - 10.3 mg/dL Final  . Total Protein 07/01/2017 7.1  6.5 - 8.1 g/dL Final  . Albumin 07/01/2017 4.1  3.5 - 5.0 g/dL Final  . AST 07/01/2017 23  15 - 41 U/L Final  . ALT 07/01/2017 16  14 - 54 U/L Final  . Alkaline Phosphatase 07/01/2017 90  38 - 126 U/L Final  . Total Bilirubin 07/01/2017 1.0  0.3 - 1.2 mg/dL Final  . GFR calc non Af Amer 07/01/2017 >60  >60 mL/min Final  . GFR calc Af Amer 07/01/2017 >60  >60 mL/min Final   Comment: (NOTE) The eGFR has been calculated using the CKD EPI equation. This calculation has not been validated in all clinical situations. eGFR's persistently <60 mL/min signify possible Chronic Kidney Disease.   Georgiann Hahn gap 07/01/2017 7  5 - 15 Final   Performed at [facility], [place] [address], [place], [place] [address]  . Prothrombin Time 07/01/2017 13.1  11.4 - 15.2 seconds Final  . INR 07/01/2017 1.00   Final   Performed at [facility], [place] [address] [place], [place] [address]  . ABO/RH(D) 07/01/2017 O POS   Final  . Antibody Screen 07/01/2017 NEG   Final  . Sample Expiration 07/01/2017 07/08/2017   Final  . Extend sample reason 07/01/2017    Final                   Value:NO TRANSFUSIONS OR PREGNANCY IN THE PAST 3 MONTHS Performed at [facility], [place] [address], [place], [place] [address]   . MRSA, PCR 07/01/2017 NEGATIVE  NEGATIVE Final  . Staphylococcus aureus 07/01/2017 NEGATIVE  NEGATIVE Final   Comment: (NOTE) The Xpert SA Assay (FDA approved for NASAL specimens in patients 4 years of age and older), is one component of a comprehensive surveillance program. It is not intended to diagnose infection nor to guide or monitor treatment. Performed at [facility], [place] [address] [place], [place] [address]   . ABO/RH(D) 07/01/2017    Final                   Value:O POS Performed at [facility], [place] [address] [place], [place]  [address]      X-Rays:No results found.  EKG: Orders placed or performed in visit on 06/01/17  . EKG 12-Lead     Hospital Course: Janet Diaz is a 72 y.o. who was admitted to [facility]. They were brought to the operating room on 07/05/2017 and underwent Procedure(s): RIGHT TOTAL KNEE ARTHROPLASTY.  Patient tolerated the procedure well and was later transferred to the recovery room and then to the orthopaedic floor for postoperative care.  They were given PO and IV analgesics for pain control following their surgery.  They were given 24 hours of postoperative antibiotics of  Anti-infectives (From admission, onward)   Start     Dose/Rate Route Frequency Ordered Stop   07/05/17 2300  vancomycin (VANCOCIN) IVPB 1000 mg/200 mL premix     1,000 mg 200 mL/hr over 60 Minutes Intravenous Every 12 hours 07/05/17 1520 07/06/17 0056   07/05/17 0915  vancomycin (VANCOCIN) IVPB 1000 mg/200 mL premix     1,000 mg 200 mL/hr over 60 Minutes Intravenous On call to O.R. 07/05/17 0910 07/05/17 1210     and started on DVT prophylaxis in the form of Aspirin.   PT and OT were ordered for total joint protocol.  Discharge planning consulted to help with postop disposition and equipment needs.  Patient had a decent night on the evening of surgery. She started to get up OOB with therapy on POD #1. Hemovac drain was pulled without difficulty. Continued to work with therapy into POD #2. Patient was seen during rounds and was ready to go home  pending progress with therapy. Dressing was changed and the incision was clean, dry, and intact with no drainage. Patient completed one additional session of therapy on POD #2 and was meeting her goals. She was discharged to home later that day in stable condition.   Diet: Regular diet Activity:WBAT Follow-up:in 2 weeks in the office with Dr. Wynelle Link Disposition - Home with outpatient physical therapy at [facility] Discharged Condition: stable   Discharge Instructions     Call MD / Call 911   Complete by:  As directed    If you experience chest pain or shortness of breath, CALL 911 and be transported to the hospital emergency room.  If you develope a fever above 101 F, pus (white drainage) or increased drainage or redness at the wound, or calf pain, call your surgeon's office.   Constipation Prevention   Complete by:  As directed    Drink plenty of fluids.  Prune juice may be helpful.  You may use a stool softener, such as Colace (over the counter) 100 mg twice a day.  Use MiraLax (over the counter) for constipation as needed.   Diet - low sodium heart healthy   Complete by:  As directed    Discharge instructions   Complete by:  As directed    Dr. Gaynelle Arabian Total Joint Specialist [facility] [address], [place], [place] [address] [phone]  TOTAL KNEE REPLACEMENT POSTOPERATIVE DIRECTIONS  Knee Rehabilitation, Guidelines Following Surgery  Results after knee surgery are often greatly improved when you follow the exercise, range of motion and muscle strengthening exercises prescribed by your doctor. Safety measures are also important to protect the knee from further injury. Any time any of these exercises cause you to have increased pain or swelling in your knee joint, decrease the amount until you are comfortable again and slowly increase them. If you have problems or questions, call your caregiver or physical therapist for advice.   HOME CARE INSTRUCTIONS  Remove items at home which could result in a fall. This includes throw rugs or furniture in walking pathways.  ICE to the affected knee every three hours for 30 minutes at a time and then as needed for pain and swelling.  Continue to use ice on the knee for pain and swelling from surgery. You may notice swelling that will progress down to the foot and ankle.  This is normal after surgery.  Elevate the leg when you are not up walking on it.   Continue to use the breathing machine which  will help keep your temperature down.  It is common for your temperature to cycle up and down following surgery, especially at night when you are not up moving around and exerting yourself.  The breathing machine keeps your lungs expanded and your temperature down. Do not place pillow under knee, focus on keeping the knee straight while resting  DIET You may resume your previous home diet once your are discharged from the hospital.  DRESSING / WOUND CARE / SHOWERING You may change your dressing every day with sterile gauze.  Please use good hand washing techniques before changing the dressing.  Do not use any lotions or creams on the incision until instructed by your surgeon. You may start showering once you are discharged home but do not submerge the incision under water. Just pat the incision dry and apply a dry gauze dressing on daily. Change the surgical dressing daily and reapply a dry dressing each time.  ACTIVITY Walk  with your walker as instructed. Use walker as long as suggested by your caregivers. Avoid periods of inactivity such as sitting longer than an hour when not asleep. This helps prevent blood clots.  You may resume a sexual relationship in one month or when given the OK by your doctor.  You may return to work once you are cleared by your doctor.  Do not drive a car for 6 weeks or until released by you surgeon.  Do not drive while taking narcotics.  WEIGHT BEARING Weight bearing as tolerated with assist device (walker, cane, etc) as directed, use it as long as suggested by your surgeon or therapist, typically at least 4-6 weeks.  POSTOPERATIVE CONSTIPATION PROTOCOL Constipation - defined medically as fewer than three stools per week and severe constipation as less than one stool per week.  One of the most common issues patients have following surgery is constipation.  Even if you have a regular bowel pattern at home, your normal regimen is likely to be disrupted due to  multiple reasons following surgery.  Combination of anesthesia, postoperative narcotics, change in appetite and fluid intake all can affect your bowels.  In order to avoid complications following surgery, here are some recommendations in order to help you during your recovery period.  Colace (docusate) - Pick up an over-the-counter form of Colace or another stool softener and take twice a day as long as you are requiring postoperative pain medications.  Take with a full glass of water daily.  If you experience loose stools or diarrhea, hold the colace until you stool forms back up.  If your symptoms do not get better within 1 week or if they get worse, check with your doctor.  Dulcolax (bisacodyl) - Pick up over-the-counter and take as directed by the product packaging as needed to assist with the movement of your bowels.  Take with a full glass of water.  Use this product as needed if not relieved by Colace only.   MiraLax (polyethylene glycol) - Pick up over-the-counter to have on hand.  MiraLax is a solution that will increase the amount of water in your bowels to assist with bowel movements.  Take as directed and can mix with a glass of water, juice, soda, coffee, or tea.  Take if you go more than two days without a movement. Do not use MiraLax more than once per day. Call your doctor if you are still constipated or irregular after using this medication for 7 days in a row.  If you continue to have problems with postoperative constipation, please contact the office for further assistance and recommendations.  If you experience "the worst abdominal pain ever" or develop nausea or vomiting, please contact the office immediatly for further recommendations for treatment.  ITCHING  If you experience itching with your medications, try taking only a single pain pill, or even half a pain pill at a time.  You can also use Benadryl over the counter for itching or also to help with sleep.   TED HOSE  STOCKINGS Wear the elastic stockings on both legs for three weeks following surgery during the day but you may remove then at night for sleeping.  MEDICATIONS See your medication summary on the "After Visit Summary" that the nursing staff will review with you prior to discharge.  You may have some home medications which will be placed on hold until you complete the course of blood thinner medication.  It is important for you to complete the blood  thinner medication as prescribed by your surgeon.  Continue your approved medications as instructed at time of discharge.  PRECAUTIONS If you experience chest pain or shortness of breath - call 911 immediately for transfer to the hospital emergency department.  If you develop a fever greater that 101 F, purulent drainage from wound, increased redness or drainage from wound, foul odor from the wound/dressing, or calf pain - CONTACT YOUR SURGEON.                                                   FOLLOW-UP APPOINTMENTS Make sure you keep all of your appointments after your operation with your surgeon and caregivers. You should call the office at the above phone number and make an appointment for approximately two weeks after the date of your surgery or on the date instructed by your surgeon outlined in the "After Visit Summary".   RANGE OF MOTION AND STRENGTHENING EXERCISES  Rehabilitation of the knee is important following a knee injury or an operation. After just a few days of immobilization, the muscles of the thigh which control the knee become weakened and shrink (atrophy). Knee exercises are designed to build up the tone and strength of the thigh muscles and to improve knee motion. Often times heat used for twenty to thirty minutes before working out will loosen up your tissues and help with improving the range of motion but do not use heat for the first two weeks following surgery. These exercises can be done on a training (exercise) mat, on the floor, on  a table or on a bed. Use what ever works the best and is most comfortable for you Knee exercises include:  Leg Lifts - While your knee is still immobilized in a splint or cast, you can do straight leg raises. Lift the leg to 60 degrees, hold for 3 sec, and slowly lower the leg. Repeat 10-20 times 2-3 times daily. Perform this exercise against resistance later as your knee gets better.  Quad and Hamstring Sets - Tighten up the muscle on the front of the thigh (Quad) and hold for 5-10 sec. Repeat this 10-20 times hourly. Hamstring sets are done by pushing the foot backward against an object and holding for 5-10 sec. Repeat as with quad sets.  Leg Slides: Lying on your back, slowly slide your foot toward your buttocks, bending your knee up off the floor (only go as far as is comfortable). Then slowly slide your foot back down until your leg is flat on the floor again. Angel Wings: Lying on your back spread your legs to the side as far apart as you can without causing discomfort.  A rehabilitation program following serious knee injuries can speed recovery and prevent re-injury in the future due to weakened muscles. Contact your doctor or a physical therapist for more information on knee rehabilitation.   IF YOU ARE TRANSFERRED TO A SKILLED REHAB FACILITY If the patient is transferred to a skilled rehab facility following release from the hospital, a list of the current medications will be sent to the facility for the patient to continue.  When discharged from the skilled rehab facility, please have the facility set up the patient's [place] prior to being released. Also, the skilled facility will be responsible for providing the patient with their medications at time of release  from the facility to include their pain medication, the muscle relaxants, and their blood thinner medication. If the patient is still at the rehab facility at time of the two week follow up appointment, the skilled  rehab facility will also need to assist the patient in arranging follow up appointment in our office and any transportation needs.  MAKE SURE YOU:  Understand these instructions.  Get help right away if you are not doing well or get worse.    Pick up stool softner and laxative for home use following surgery while on pain medications. Do not submerge incision under water. Please use good hand washing techniques while changing dressing each day. May shower starting three days after surgery. Please use a clean towel to pat the incision dry following showers. Continue to use ice for pain and swelling after surgery. Do not use any lotions or creams on the incision until instructed by your surgeon.   Increase activity slowly as tolerated   Complete by:  As directed      Allergies as of 07/07/2017      Reactions   Omeprazole Itching   Ampicillin Rash   Has patient had a PCN reaction causing immediate rash, facial/tongue/throat swelling, SOB or lightheadedness with hypotension: Unknown Has patient had a PCN reaction causing severe rash involving mucus membranes or skin necrosis: Unknown Has patient had a PCN reaction that required hospitalization: No Has patient had a PCN reaction occurring within the last 10 years: No If all of the above answers are "NO", then may proceed with Cephalosporin use.      Medication List    STOP taking these medications   ibuprofen 200 MG tablet Commonly known as:  ADVIL,MOTRIN     TAKE these medications   aspirin 325 MG EC tablet Take 1 tablet (325 mg total) by mouth 2 (two) times daily. To prevent blood clots   citalopram 40 MG tablet Commonly known as:  CELEXA Take 40 mg by mouth at bedtime.   ergocalciferol 50000 units capsule Commonly known as:  VITAMIN D2 Take 50,000 Units by mouth every Wednesday. AT BEDTIME   fluticasone 50 MCG/ACT nasal spray Commonly known as:  FLONASE Place 1 spray into both nostrils 2 (two) times daily.   furosemide  20 MG tablet Commonly known as:  LASIX Take 20 mg by mouth daily.   loratadine 10 MG tablet Commonly known as:  CLARITIN Take 10 mg by mouth daily.   meclizine 12.5 MG tablet Commonly known as:  ANTIVERT Take 1-2 tablets (12.5-25 mg total) by mouth 2 (two) times daily as needed (for vertigo). What changed:  how much to take   methocarbamol 500 MG tablet Commonly known as:  ROBAXIN Take 1 tablet (500 mg total) by mouth every 6 (six) hours as needed for muscle spasms.   montelukast 10 MG tablet Commonly known as:  SINGULAIR Take 10 mg by mouth at bedtime.   oxyCODONE 5 MG immediate release tablet Commonly known as:  Oxy IR/ROXICODONE Take 1-2 tablets (5-10 mg total) by mouth every 6 (six) hours as needed for moderate pain (pain score 4-6).   pantoprazole 40 MG tablet Commonly known as:  PROTONIX Take 40 mg by mouth daily before breakfast.   potassium chloride 10 MEQ CR capsule Commonly known as:  MICRO-K Take 10 mEq by mouth daily.      Follow-up Information    Gaynelle Arabian, MD Follow up in 2 week(s).   Specialty:  Orthopedic Surgery Contact information: [address]  [address] [place] [address] [phone]        [facility] - Dme Follow up.   Why:  home rolling walker, bedside commode Contact information: [place] [address] [phone]           Signed: Theresa Duty, PA-C Orthopaedic Surgery 07/12/2017, 3:07 PM

## 2017-07-14 DIAGNOSIS — Z96651 Presence of right artificial knee joint: Secondary | ICD-10-CM | POA: Diagnosis not present

## 2017-07-14 DIAGNOSIS — R262 Difficulty in walking, not elsewhere classified: Secondary | ICD-10-CM | POA: Diagnosis not present

## 2017-07-14 DIAGNOSIS — M25561 Pain in right knee: Secondary | ICD-10-CM | POA: Diagnosis not present

## 2017-07-14 DIAGNOSIS — M62551 Muscle wasting and atrophy, not elsewhere classified, right thigh: Secondary | ICD-10-CM | POA: Diagnosis not present

## 2017-07-16 DIAGNOSIS — R262 Difficulty in walking, not elsewhere classified: Secondary | ICD-10-CM | POA: Diagnosis not present

## 2017-07-16 DIAGNOSIS — Z96651 Presence of right artificial knee joint: Secondary | ICD-10-CM | POA: Diagnosis not present

## 2017-07-16 DIAGNOSIS — M62551 Muscle wasting and atrophy, not elsewhere classified, right thigh: Secondary | ICD-10-CM | POA: Diagnosis not present

## 2017-07-16 DIAGNOSIS — M25561 Pain in right knee: Secondary | ICD-10-CM | POA: Diagnosis not present

## 2017-07-19 DIAGNOSIS — M62551 Muscle wasting and atrophy, not elsewhere classified, right thigh: Secondary | ICD-10-CM | POA: Diagnosis not present

## 2017-07-19 DIAGNOSIS — Z96651 Presence of right artificial knee joint: Secondary | ICD-10-CM | POA: Diagnosis not present

## 2017-07-19 DIAGNOSIS — R262 Difficulty in walking, not elsewhere classified: Secondary | ICD-10-CM | POA: Diagnosis not present

## 2017-07-19 DIAGNOSIS — M25561 Pain in right knee: Secondary | ICD-10-CM | POA: Diagnosis not present

## 2017-07-21 DIAGNOSIS — M25561 Pain in right knee: Secondary | ICD-10-CM | POA: Diagnosis not present

## 2017-07-21 DIAGNOSIS — Z96651 Presence of right artificial knee joint: Secondary | ICD-10-CM | POA: Diagnosis not present

## 2017-07-21 DIAGNOSIS — M62551 Muscle wasting and atrophy, not elsewhere classified, right thigh: Secondary | ICD-10-CM | POA: Diagnosis not present

## 2017-07-21 DIAGNOSIS — R262 Difficulty in walking, not elsewhere classified: Secondary | ICD-10-CM | POA: Diagnosis not present

## 2017-07-23 DIAGNOSIS — Z96651 Presence of right artificial knee joint: Secondary | ICD-10-CM | POA: Diagnosis not present

## 2017-07-23 DIAGNOSIS — M62551 Muscle wasting and atrophy, not elsewhere classified, right thigh: Secondary | ICD-10-CM | POA: Diagnosis not present

## 2017-07-23 DIAGNOSIS — R262 Difficulty in walking, not elsewhere classified: Secondary | ICD-10-CM | POA: Diagnosis not present

## 2017-07-23 DIAGNOSIS — M25561 Pain in right knee: Secondary | ICD-10-CM | POA: Diagnosis not present

## 2017-07-26 DIAGNOSIS — M25561 Pain in right knee: Secondary | ICD-10-CM | POA: Diagnosis not present

## 2017-07-26 DIAGNOSIS — M62551 Muscle wasting and atrophy, not elsewhere classified, right thigh: Secondary | ICD-10-CM | POA: Diagnosis not present

## 2017-07-26 DIAGNOSIS — Z96651 Presence of right artificial knee joint: Secondary | ICD-10-CM | POA: Diagnosis not present

## 2017-07-26 DIAGNOSIS — R262 Difficulty in walking, not elsewhere classified: Secondary | ICD-10-CM | POA: Diagnosis not present

## 2017-07-28 DIAGNOSIS — R262 Difficulty in walking, not elsewhere classified: Secondary | ICD-10-CM | POA: Diagnosis not present

## 2017-07-28 DIAGNOSIS — M62551 Muscle wasting and atrophy, not elsewhere classified, right thigh: Secondary | ICD-10-CM | POA: Diagnosis not present

## 2017-07-28 DIAGNOSIS — Z96651 Presence of right artificial knee joint: Secondary | ICD-10-CM | POA: Diagnosis not present

## 2017-07-28 DIAGNOSIS — M25561 Pain in right knee: Secondary | ICD-10-CM | POA: Diagnosis not present

## 2017-07-30 DIAGNOSIS — Z96651 Presence of right artificial knee joint: Secondary | ICD-10-CM | POA: Diagnosis not present

## 2017-07-30 DIAGNOSIS — M62551 Muscle wasting and atrophy, not elsewhere classified, right thigh: Secondary | ICD-10-CM | POA: Diagnosis not present

## 2017-07-30 DIAGNOSIS — R262 Difficulty in walking, not elsewhere classified: Secondary | ICD-10-CM | POA: Diagnosis not present

## 2017-07-30 DIAGNOSIS — M25561 Pain in right knee: Secondary | ICD-10-CM | POA: Diagnosis not present

## 2017-08-02 ENCOUNTER — Ambulatory Visit: Payer: Medicare Other | Admitting: Cardiology

## 2017-08-02 DIAGNOSIS — M25561 Pain in right knee: Secondary | ICD-10-CM | POA: Diagnosis not present

## 2017-08-02 DIAGNOSIS — Z96651 Presence of right artificial knee joint: Secondary | ICD-10-CM | POA: Diagnosis not present

## 2017-08-02 DIAGNOSIS — M62551 Muscle wasting and atrophy, not elsewhere classified, right thigh: Secondary | ICD-10-CM | POA: Diagnosis not present

## 2017-08-02 DIAGNOSIS — R262 Difficulty in walking, not elsewhere classified: Secondary | ICD-10-CM | POA: Diagnosis not present

## 2017-08-04 DIAGNOSIS — Z96651 Presence of right artificial knee joint: Secondary | ICD-10-CM | POA: Diagnosis not present

## 2017-08-04 DIAGNOSIS — M62551 Muscle wasting and atrophy, not elsewhere classified, right thigh: Secondary | ICD-10-CM | POA: Diagnosis not present

## 2017-08-04 DIAGNOSIS — M25561 Pain in right knee: Secondary | ICD-10-CM | POA: Diagnosis not present

## 2017-08-04 DIAGNOSIS — R262 Difficulty in walking, not elsewhere classified: Secondary | ICD-10-CM | POA: Diagnosis not present

## 2017-08-09 DIAGNOSIS — Z96651 Presence of right artificial knee joint: Secondary | ICD-10-CM | POA: Diagnosis not present

## 2017-08-09 DIAGNOSIS — M62551 Muscle wasting and atrophy, not elsewhere classified, right thigh: Secondary | ICD-10-CM | POA: Diagnosis not present

## 2017-08-09 DIAGNOSIS — R262 Difficulty in walking, not elsewhere classified: Secondary | ICD-10-CM | POA: Diagnosis not present

## 2017-08-09 DIAGNOSIS — M25561 Pain in right knee: Secondary | ICD-10-CM | POA: Diagnosis not present

## 2017-08-10 DIAGNOSIS — Z96651 Presence of right artificial knee joint: Secondary | ICD-10-CM | POA: Diagnosis not present

## 2017-08-10 DIAGNOSIS — Z471 Aftercare following joint replacement surgery: Secondary | ICD-10-CM | POA: Diagnosis not present

## 2017-08-11 DIAGNOSIS — M62551 Muscle wasting and atrophy, not elsewhere classified, right thigh: Secondary | ICD-10-CM | POA: Diagnosis not present

## 2017-08-11 DIAGNOSIS — Z96651 Presence of right artificial knee joint: Secondary | ICD-10-CM | POA: Diagnosis not present

## 2017-08-11 DIAGNOSIS — M25561 Pain in right knee: Secondary | ICD-10-CM | POA: Diagnosis not present

## 2017-08-11 DIAGNOSIS — R262 Difficulty in walking, not elsewhere classified: Secondary | ICD-10-CM | POA: Diagnosis not present

## 2017-08-16 DIAGNOSIS — M62551 Muscle wasting and atrophy, not elsewhere classified, right thigh: Secondary | ICD-10-CM | POA: Diagnosis not present

## 2017-08-16 DIAGNOSIS — M25561 Pain in right knee: Secondary | ICD-10-CM | POA: Diagnosis not present

## 2017-08-16 DIAGNOSIS — R262 Difficulty in walking, not elsewhere classified: Secondary | ICD-10-CM | POA: Diagnosis not present

## 2017-08-16 DIAGNOSIS — Z96651 Presence of right artificial knee joint: Secondary | ICD-10-CM | POA: Diagnosis not present

## 2017-08-19 DIAGNOSIS — M25561 Pain in right knee: Secondary | ICD-10-CM | POA: Diagnosis not present

## 2017-08-19 DIAGNOSIS — M62551 Muscle wasting and atrophy, not elsewhere classified, right thigh: Secondary | ICD-10-CM | POA: Diagnosis not present

## 2017-08-19 DIAGNOSIS — R262 Difficulty in walking, not elsewhere classified: Secondary | ICD-10-CM | POA: Diagnosis not present

## 2017-08-19 DIAGNOSIS — Z96651 Presence of right artificial knee joint: Secondary | ICD-10-CM | POA: Diagnosis not present

## 2017-08-20 DIAGNOSIS — R262 Difficulty in walking, not elsewhere classified: Secondary | ICD-10-CM | POA: Diagnosis not present

## 2017-08-20 DIAGNOSIS — M25561 Pain in right knee: Secondary | ICD-10-CM | POA: Diagnosis not present

## 2017-08-20 DIAGNOSIS — Z96651 Presence of right artificial knee joint: Secondary | ICD-10-CM | POA: Diagnosis not present

## 2017-08-20 DIAGNOSIS — M62551 Muscle wasting and atrophy, not elsewhere classified, right thigh: Secondary | ICD-10-CM | POA: Diagnosis not present

## 2017-08-23 DIAGNOSIS — M62551 Muscle wasting and atrophy, not elsewhere classified, right thigh: Secondary | ICD-10-CM | POA: Diagnosis not present

## 2017-08-23 DIAGNOSIS — M25561 Pain in right knee: Secondary | ICD-10-CM | POA: Diagnosis not present

## 2017-08-23 DIAGNOSIS — R262 Difficulty in walking, not elsewhere classified: Secondary | ICD-10-CM | POA: Diagnosis not present

## 2017-08-23 DIAGNOSIS — Z96651 Presence of right artificial knee joint: Secondary | ICD-10-CM | POA: Diagnosis not present

## 2017-08-24 DIAGNOSIS — L570 Actinic keratosis: Secondary | ICD-10-CM | POA: Diagnosis not present

## 2017-08-25 DIAGNOSIS — R262 Difficulty in walking, not elsewhere classified: Secondary | ICD-10-CM | POA: Diagnosis not present

## 2017-08-25 DIAGNOSIS — Z96651 Presence of right artificial knee joint: Secondary | ICD-10-CM | POA: Diagnosis not present

## 2017-08-25 DIAGNOSIS — M62551 Muscle wasting and atrophy, not elsewhere classified, right thigh: Secondary | ICD-10-CM | POA: Diagnosis not present

## 2017-08-25 DIAGNOSIS — M25561 Pain in right knee: Secondary | ICD-10-CM | POA: Diagnosis not present

## 2017-08-30 DIAGNOSIS — Z96651 Presence of right artificial knee joint: Secondary | ICD-10-CM | POA: Diagnosis not present

## 2017-08-30 DIAGNOSIS — M62551 Muscle wasting and atrophy, not elsewhere classified, right thigh: Secondary | ICD-10-CM | POA: Diagnosis not present

## 2017-08-30 DIAGNOSIS — M25561 Pain in right knee: Secondary | ICD-10-CM | POA: Diagnosis not present

## 2017-08-30 DIAGNOSIS — R262 Difficulty in walking, not elsewhere classified: Secondary | ICD-10-CM | POA: Diagnosis not present

## 2017-09-08 DIAGNOSIS — Z96651 Presence of right artificial knee joint: Secondary | ICD-10-CM | POA: Diagnosis not present

## 2017-09-08 DIAGNOSIS — M62551 Muscle wasting and atrophy, not elsewhere classified, right thigh: Secondary | ICD-10-CM | POA: Diagnosis not present

## 2017-09-08 DIAGNOSIS — M25561 Pain in right knee: Secondary | ICD-10-CM | POA: Diagnosis not present

## 2017-09-08 DIAGNOSIS — R262 Difficulty in walking, not elsewhere classified: Secondary | ICD-10-CM | POA: Diagnosis not present

## 2017-09-10 DIAGNOSIS — R262 Difficulty in walking, not elsewhere classified: Secondary | ICD-10-CM | POA: Diagnosis not present

## 2017-09-10 DIAGNOSIS — Z96651 Presence of right artificial knee joint: Secondary | ICD-10-CM | POA: Diagnosis not present

## 2017-09-10 DIAGNOSIS — M25561 Pain in right knee: Secondary | ICD-10-CM | POA: Diagnosis not present

## 2017-09-10 DIAGNOSIS — M62551 Muscle wasting and atrophy, not elsewhere classified, right thigh: Secondary | ICD-10-CM | POA: Diagnosis not present

## 2017-09-15 DIAGNOSIS — R262 Difficulty in walking, not elsewhere classified: Secondary | ICD-10-CM | POA: Diagnosis not present

## 2017-09-15 DIAGNOSIS — Z96651 Presence of right artificial knee joint: Secondary | ICD-10-CM | POA: Diagnosis not present

## 2017-09-15 DIAGNOSIS — M62551 Muscle wasting and atrophy, not elsewhere classified, right thigh: Secondary | ICD-10-CM | POA: Diagnosis not present

## 2017-09-15 DIAGNOSIS — M25561 Pain in right knee: Secondary | ICD-10-CM | POA: Diagnosis not present

## 2017-09-21 DIAGNOSIS — R262 Difficulty in walking, not elsewhere classified: Secondary | ICD-10-CM | POA: Diagnosis not present

## 2017-09-21 DIAGNOSIS — M62551 Muscle wasting and atrophy, not elsewhere classified, right thigh: Secondary | ICD-10-CM | POA: Diagnosis not present

## 2017-09-21 DIAGNOSIS — Z96651 Presence of right artificial knee joint: Secondary | ICD-10-CM | POA: Diagnosis not present

## 2017-09-21 DIAGNOSIS — M25561 Pain in right knee: Secondary | ICD-10-CM | POA: Diagnosis not present

## 2017-09-23 DIAGNOSIS — M25561 Pain in right knee: Secondary | ICD-10-CM | POA: Diagnosis not present

## 2017-09-23 DIAGNOSIS — M62551 Muscle wasting and atrophy, not elsewhere classified, right thigh: Secondary | ICD-10-CM | POA: Diagnosis not present

## 2017-09-23 DIAGNOSIS — Z96651 Presence of right artificial knee joint: Secondary | ICD-10-CM | POA: Diagnosis not present

## 2017-09-23 DIAGNOSIS — R262 Difficulty in walking, not elsewhere classified: Secondary | ICD-10-CM | POA: Diagnosis not present

## 2017-09-27 DIAGNOSIS — M62551 Muscle wasting and atrophy, not elsewhere classified, right thigh: Secondary | ICD-10-CM | POA: Diagnosis not present

## 2017-09-27 DIAGNOSIS — R262 Difficulty in walking, not elsewhere classified: Secondary | ICD-10-CM | POA: Diagnosis not present

## 2017-09-27 DIAGNOSIS — M25561 Pain in right knee: Secondary | ICD-10-CM | POA: Diagnosis not present

## 2017-09-27 DIAGNOSIS — Z96651 Presence of right artificial knee joint: Secondary | ICD-10-CM | POA: Diagnosis not present

## 2017-10-15 DIAGNOSIS — Z9181 History of falling: Secondary | ICD-10-CM | POA: Diagnosis not present

## 2017-10-15 DIAGNOSIS — Z6837 Body mass index (BMI) 37.0-37.9, adult: Secondary | ICD-10-CM | POA: Diagnosis not present

## 2017-10-15 DIAGNOSIS — Z1239 Encounter for other screening for malignant neoplasm of breast: Secondary | ICD-10-CM | POA: Diagnosis not present

## 2017-10-15 DIAGNOSIS — Z0000 Encounter for general adult medical examination without abnormal findings: Secondary | ICD-10-CM | POA: Diagnosis not present

## 2017-10-15 DIAGNOSIS — N959 Unspecified menopausal and perimenopausal disorder: Secondary | ICD-10-CM | POA: Diagnosis not present

## 2017-10-15 DIAGNOSIS — E785 Hyperlipidemia, unspecified: Secondary | ICD-10-CM | POA: Diagnosis not present

## 2017-12-24 DIAGNOSIS — M25562 Pain in left knee: Secondary | ICD-10-CM | POA: Diagnosis not present

## 2017-12-24 DIAGNOSIS — Z471 Aftercare following joint replacement surgery: Secondary | ICD-10-CM | POA: Diagnosis not present

## 2017-12-24 DIAGNOSIS — M1712 Unilateral primary osteoarthritis, left knee: Secondary | ICD-10-CM | POA: Diagnosis not present

## 2017-12-24 DIAGNOSIS — Z96651 Presence of right artificial knee joint: Secondary | ICD-10-CM | POA: Diagnosis not present

## 2017-12-28 DIAGNOSIS — E559 Vitamin D deficiency, unspecified: Secondary | ICD-10-CM | POA: Diagnosis not present

## 2017-12-28 DIAGNOSIS — E785 Hyperlipidemia, unspecified: Secondary | ICD-10-CM | POA: Diagnosis not present

## 2017-12-28 DIAGNOSIS — Z23 Encounter for immunization: Secondary | ICD-10-CM | POA: Diagnosis not present

## 2017-12-28 DIAGNOSIS — R739 Hyperglycemia, unspecified: Secondary | ICD-10-CM | POA: Diagnosis not present

## 2017-12-28 DIAGNOSIS — E669 Obesity, unspecified: Secondary | ICD-10-CM | POA: Diagnosis not present

## 2017-12-28 DIAGNOSIS — Z79899 Other long term (current) drug therapy: Secondary | ICD-10-CM | POA: Diagnosis not present

## 2017-12-28 DIAGNOSIS — Z6837 Body mass index (BMI) 37.0-37.9, adult: Secondary | ICD-10-CM | POA: Diagnosis not present

## 2017-12-31 DIAGNOSIS — D485 Neoplasm of uncertain behavior of skin: Secondary | ICD-10-CM | POA: Diagnosis not present

## 2017-12-31 DIAGNOSIS — L080 Pyoderma: Secondary | ICD-10-CM | POA: Diagnosis not present

## 2018-01-31 DIAGNOSIS — Z1231 Encounter for screening mammogram for malignant neoplasm of breast: Secondary | ICD-10-CM | POA: Diagnosis not present

## 2018-02-21 DIAGNOSIS — Z6837 Body mass index (BMI) 37.0-37.9, adult: Secondary | ICD-10-CM | POA: Diagnosis not present

## 2018-02-21 DIAGNOSIS — J019 Acute sinusitis, unspecified: Secondary | ICD-10-CM | POA: Diagnosis not present

## 2018-02-21 DIAGNOSIS — J209 Acute bronchitis, unspecified: Secondary | ICD-10-CM | POA: Diagnosis not present

## 2018-02-21 DIAGNOSIS — R6889 Other general symptoms and signs: Secondary | ICD-10-CM | POA: Diagnosis not present

## 2018-06-21 DIAGNOSIS — M170 Bilateral primary osteoarthritis of knee: Secondary | ICD-10-CM | POA: Diagnosis not present

## 2018-06-21 DIAGNOSIS — M25521 Pain in right elbow: Secondary | ICD-10-CM | POA: Diagnosis not present

## 2018-06-21 DIAGNOSIS — M1712 Unilateral primary osteoarthritis, left knee: Secondary | ICD-10-CM | POA: Diagnosis not present

## 2018-06-21 DIAGNOSIS — S8001XA Contusion of right knee, initial encounter: Secondary | ICD-10-CM | POA: Diagnosis not present

## 2018-09-20 DIAGNOSIS — F418 Other specified anxiety disorders: Secondary | ICD-10-CM | POA: Diagnosis not present

## 2018-09-20 DIAGNOSIS — J309 Allergic rhinitis, unspecified: Secondary | ICD-10-CM | POA: Diagnosis not present

## 2018-09-20 DIAGNOSIS — E785 Hyperlipidemia, unspecified: Secondary | ICD-10-CM | POA: Diagnosis not present

## 2018-09-20 DIAGNOSIS — E559 Vitamin D deficiency, unspecified: Secondary | ICD-10-CM | POA: Diagnosis not present

## 2018-09-27 DIAGNOSIS — E785 Hyperlipidemia, unspecified: Secondary | ICD-10-CM | POA: Diagnosis not present

## 2018-09-27 DIAGNOSIS — R739 Hyperglycemia, unspecified: Secondary | ICD-10-CM | POA: Diagnosis not present

## 2018-09-27 DIAGNOSIS — E559 Vitamin D deficiency, unspecified: Secondary | ICD-10-CM | POA: Diagnosis not present

## 2018-09-27 DIAGNOSIS — Z79899 Other long term (current) drug therapy: Secondary | ICD-10-CM | POA: Diagnosis not present

## 2018-11-18 DIAGNOSIS — S61401A Unspecified open wound of right hand, initial encounter: Secondary | ICD-10-CM | POA: Diagnosis not present

## 2018-11-22 DIAGNOSIS — Z1331 Encounter for screening for depression: Secondary | ICD-10-CM | POA: Diagnosis not present

## 2018-11-22 DIAGNOSIS — Z6837 Body mass index (BMI) 37.0-37.9, adult: Secondary | ICD-10-CM | POA: Diagnosis not present

## 2018-11-22 DIAGNOSIS — Z0000 Encounter for general adult medical examination without abnormal findings: Secondary | ICD-10-CM | POA: Diagnosis not present

## 2018-11-22 DIAGNOSIS — Z9181 History of falling: Secondary | ICD-10-CM | POA: Diagnosis not present

## 2018-11-22 DIAGNOSIS — E785 Hyperlipidemia, unspecified: Secondary | ICD-10-CM | POA: Diagnosis not present

## 2018-11-22 DIAGNOSIS — Z139 Encounter for screening, unspecified: Secondary | ICD-10-CM | POA: Diagnosis not present

## 2019-06-19 IMAGING — DX DG SHOULDER 2+V*R*
3 series · 3 of 3 positions shown · non-contrast
Comparison: None.

CLINICAL DATA: Right shoulder pain after a fall

EXAM:
RIGHT SHOULDER - 2+ VIEW

[w shoulder external right]
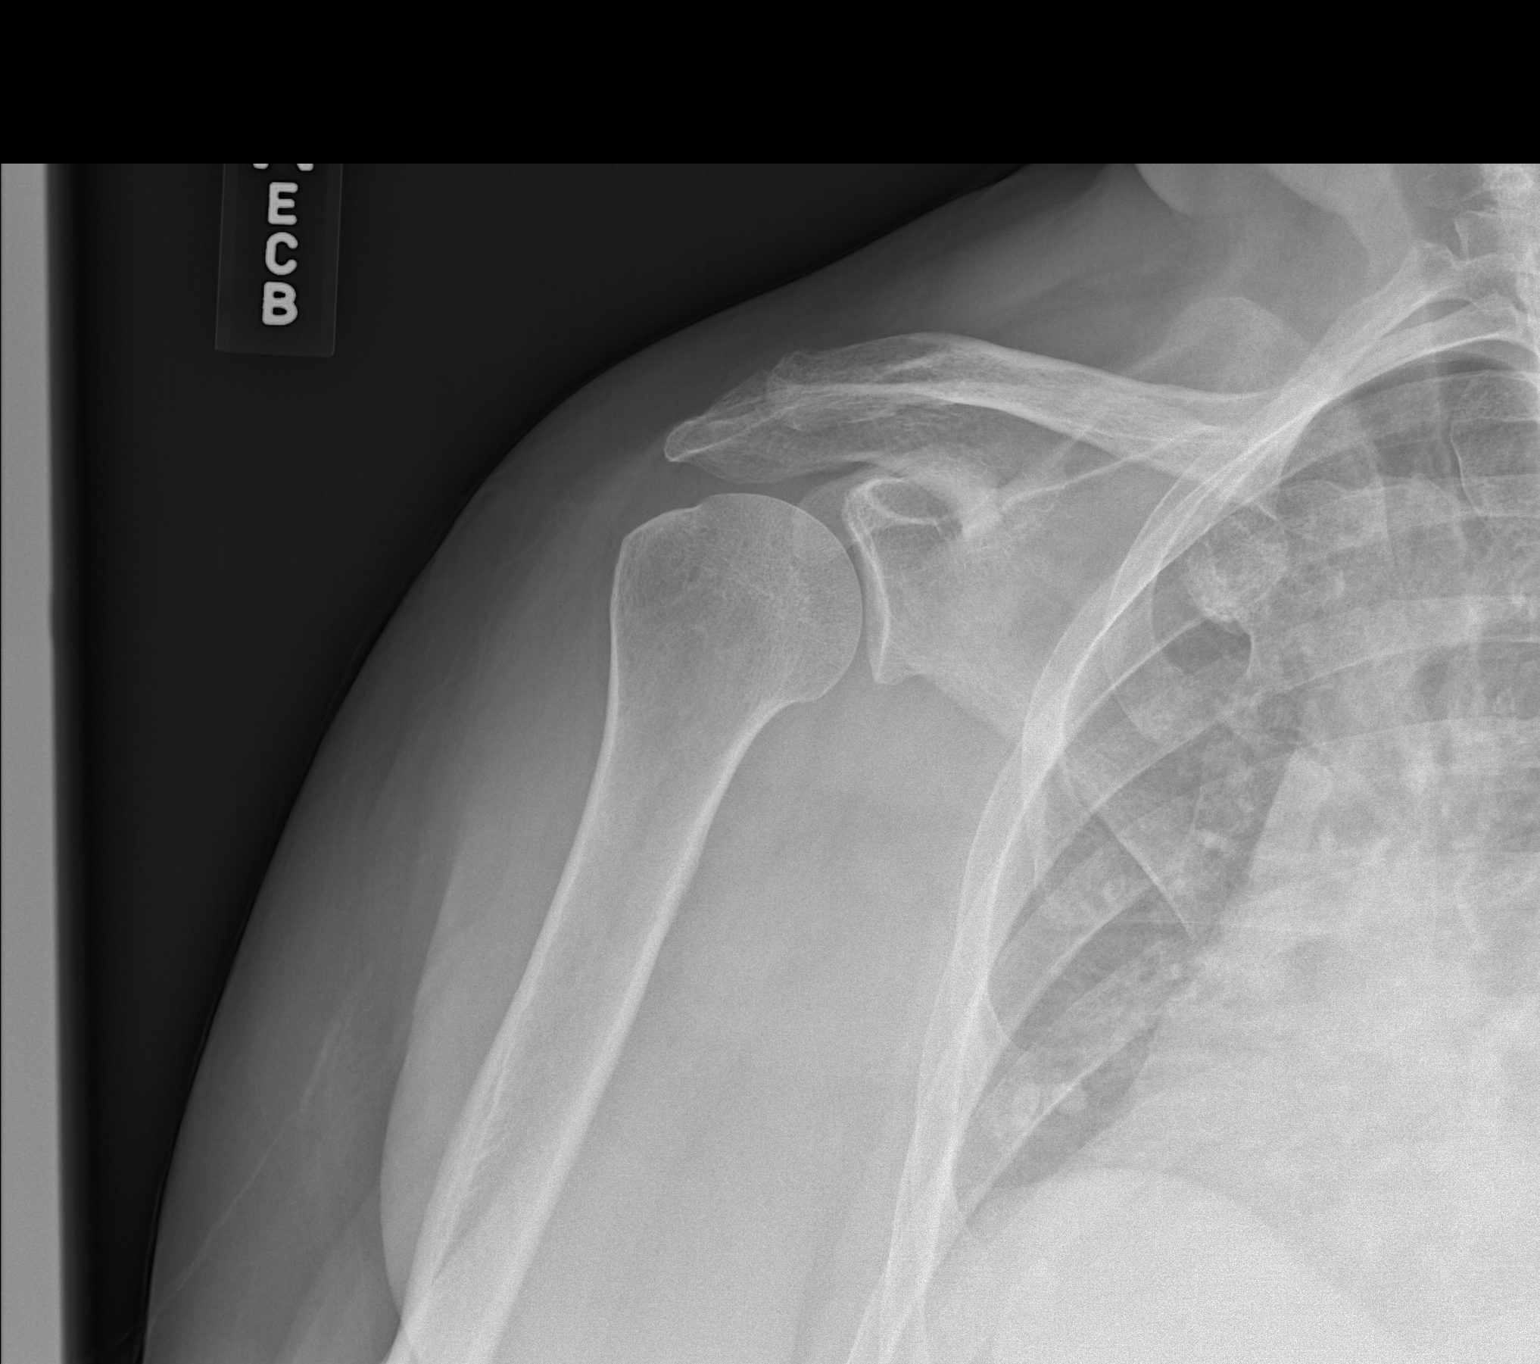

[w shoulder y-view right]
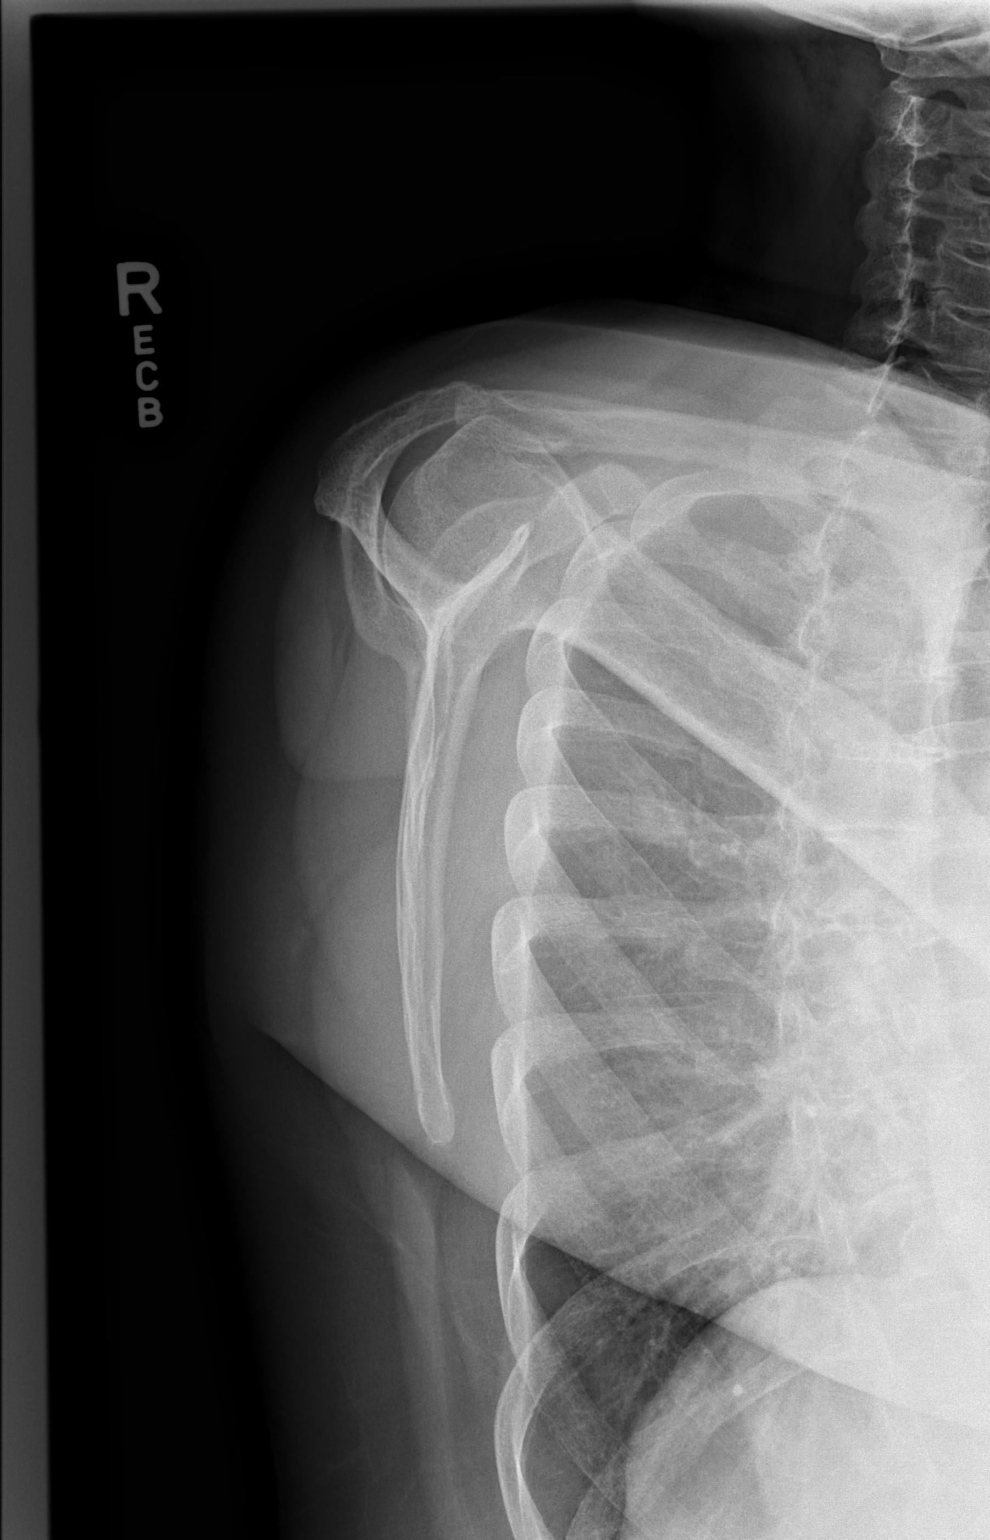

[x shoulder axillary right]
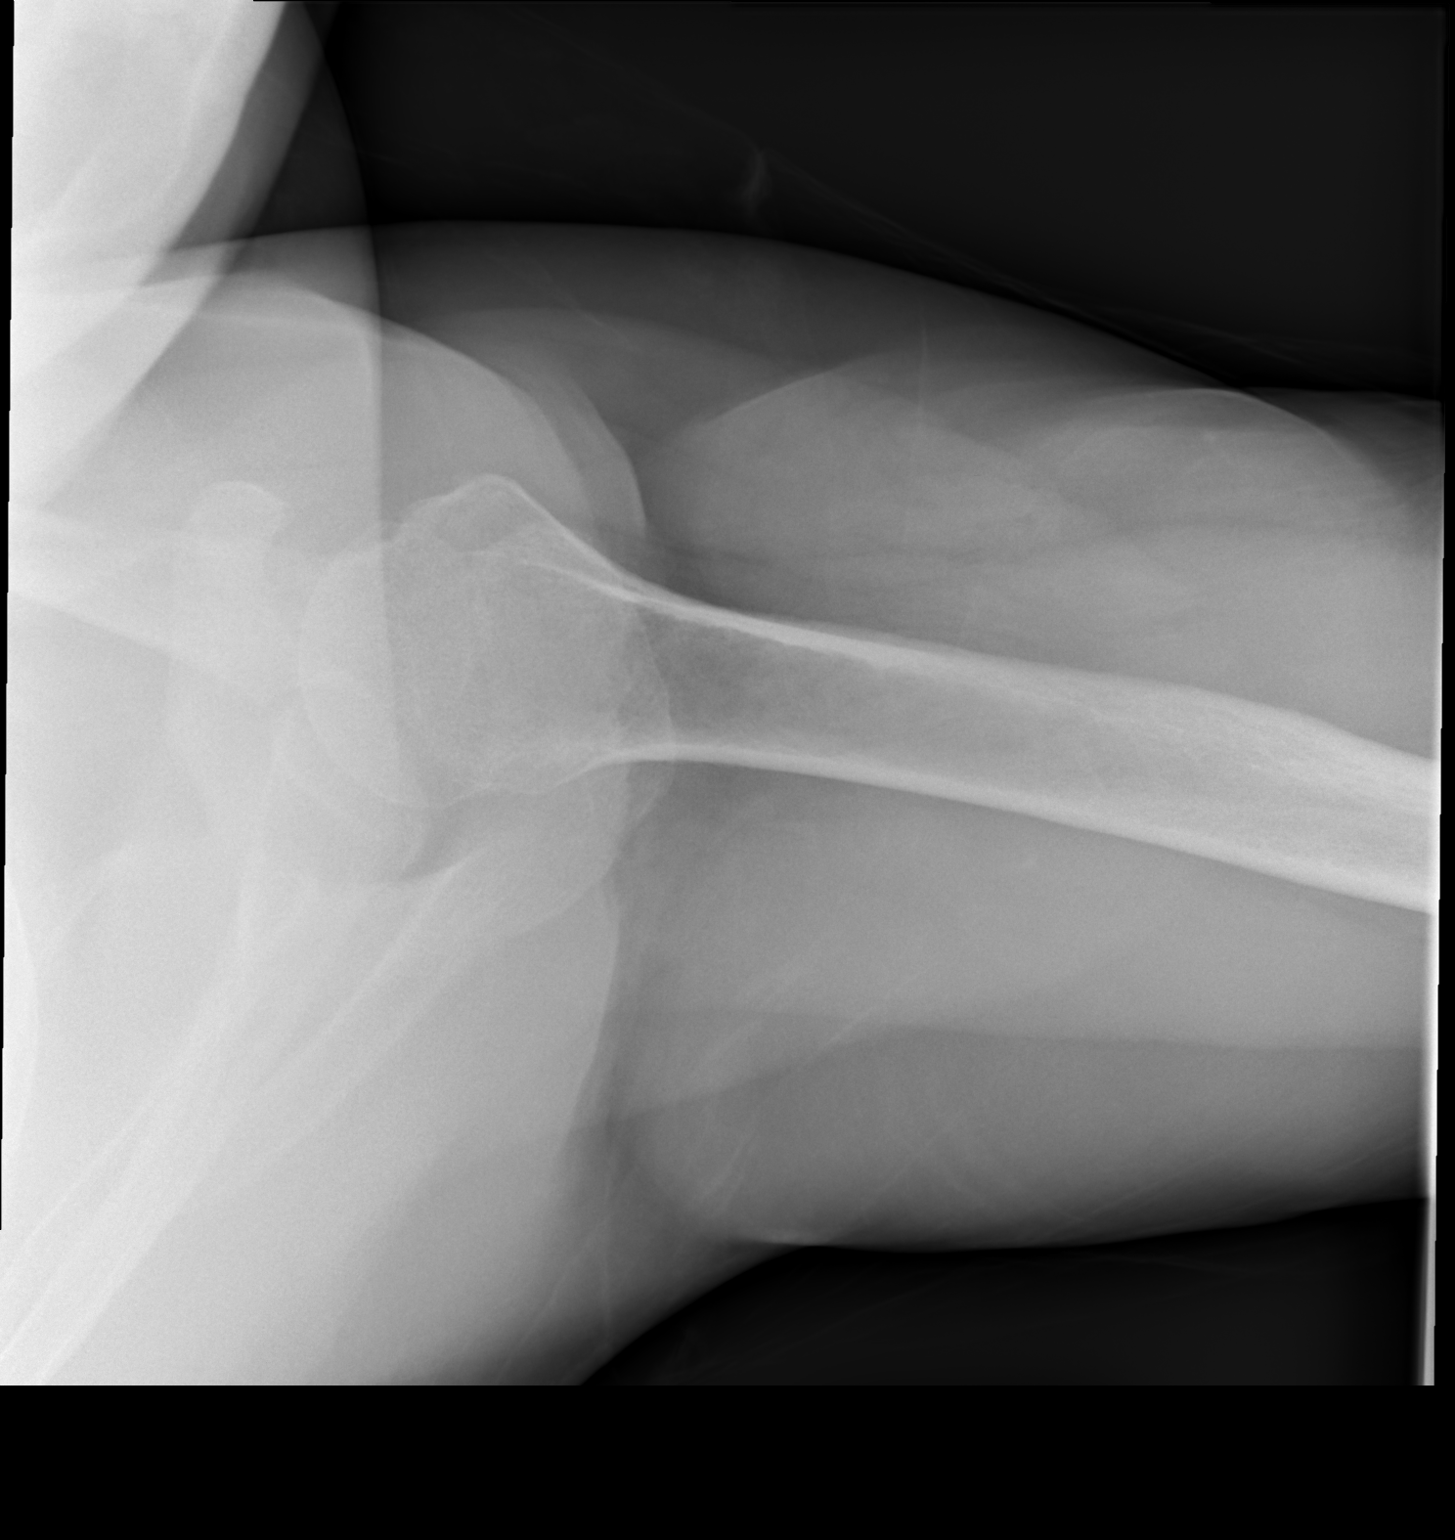

[3 of 3 positions shown; findings below may reference images not displayed]

FINDINGS: There is no evidence of fracture or dislocation. There is no
evidence of arthropathy or other focal bone abnormality. Soft
tissues are unremarkable.
IMPRESSION: Negative.

## 2019-06-19 IMAGING — DX DG LUMBAR SPINE COMPLETE 4+V
5 series · 5 of 5 positions shown · non-contrast
Comparison: None.

CLINICAL DATA: Low back pain after a fall

EXAM:
LUMBAR SPINE - COMPLETE 4+ VIEW

[t lumbar spine ap]
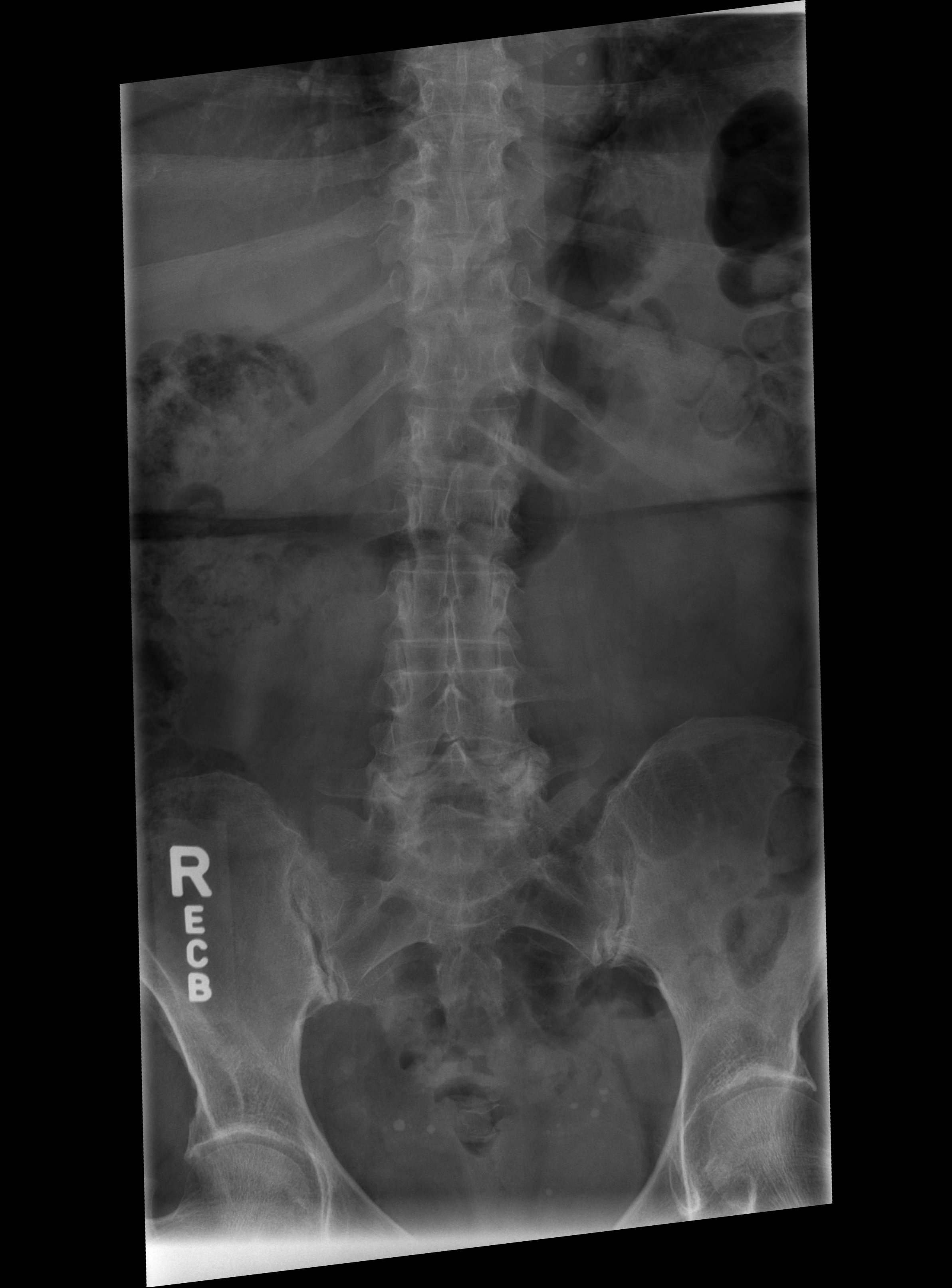

[t lumbar spine obl (1 of 2)]
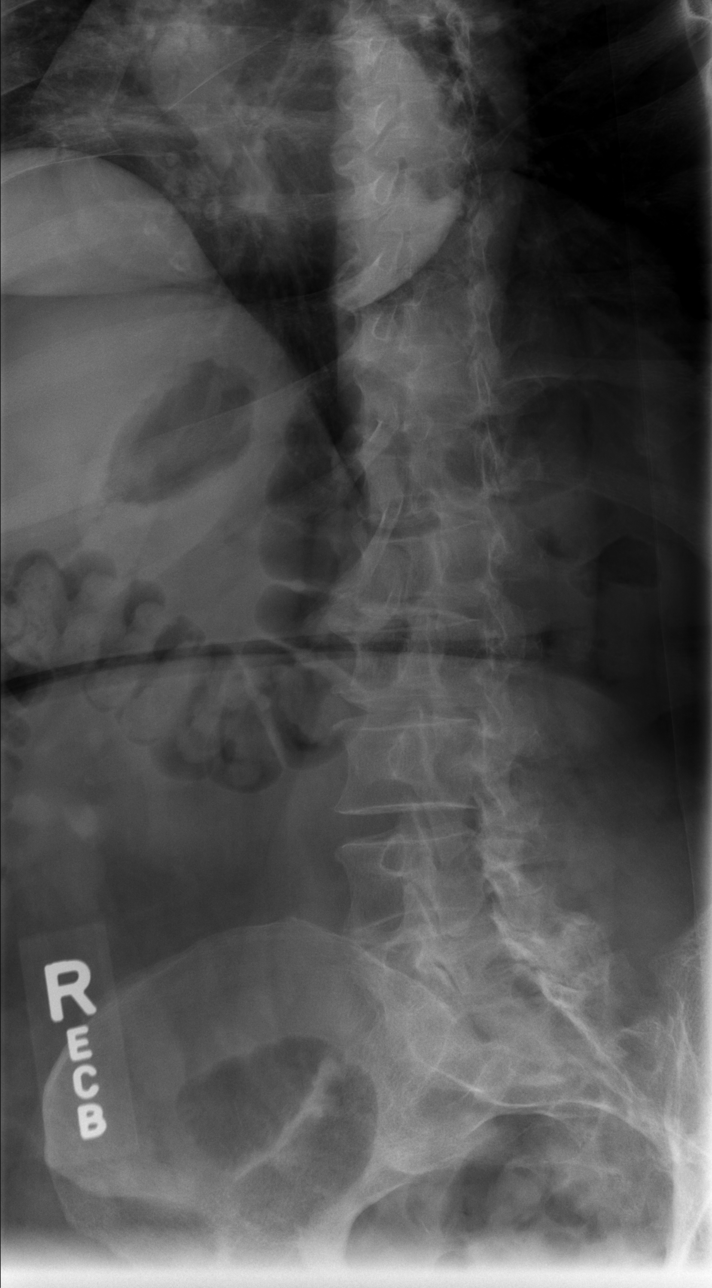

[t lumbar spine obl (2 of 2)]
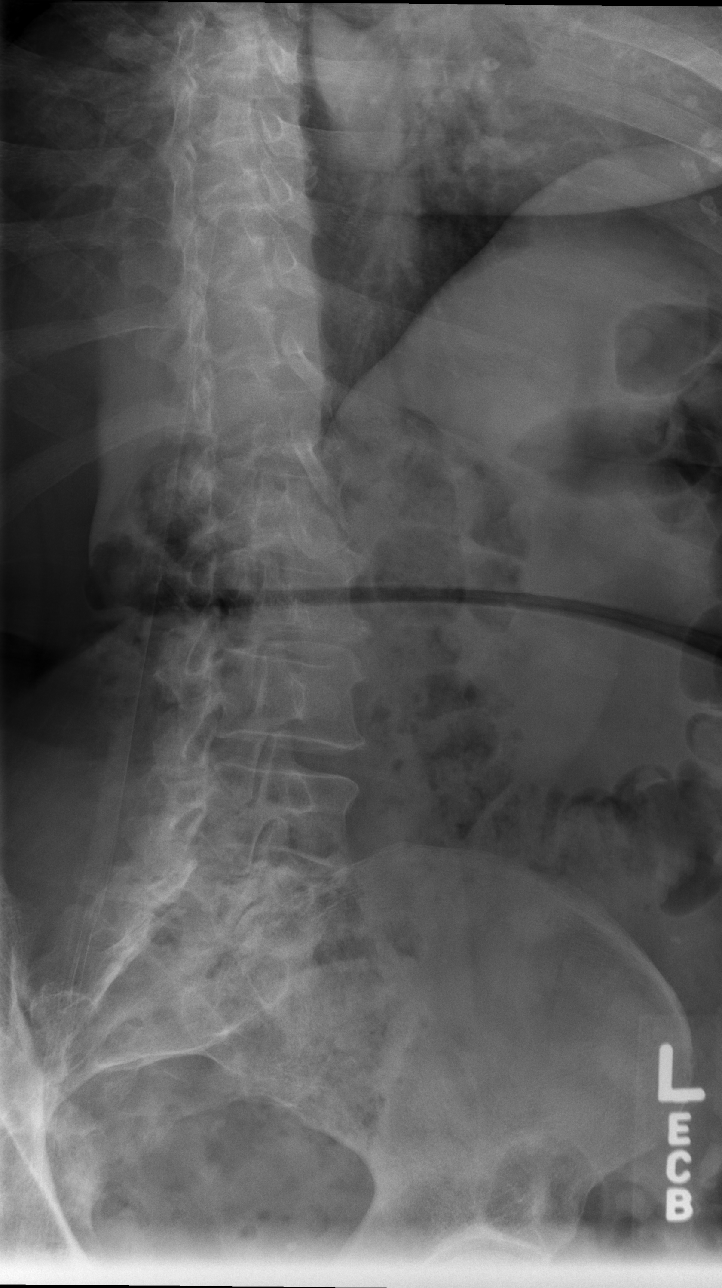

[t lumbar spine lat]
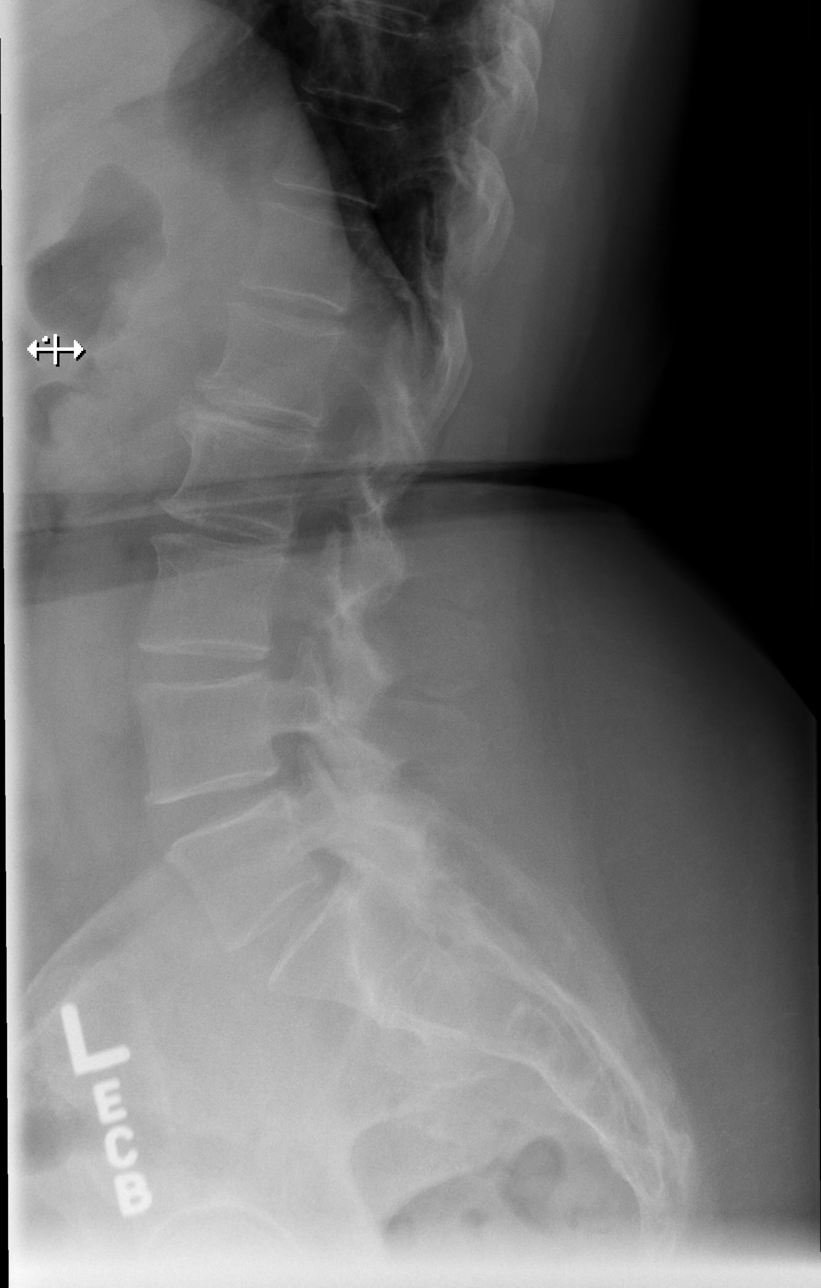

[t lumbar l-5 s-1 spot]
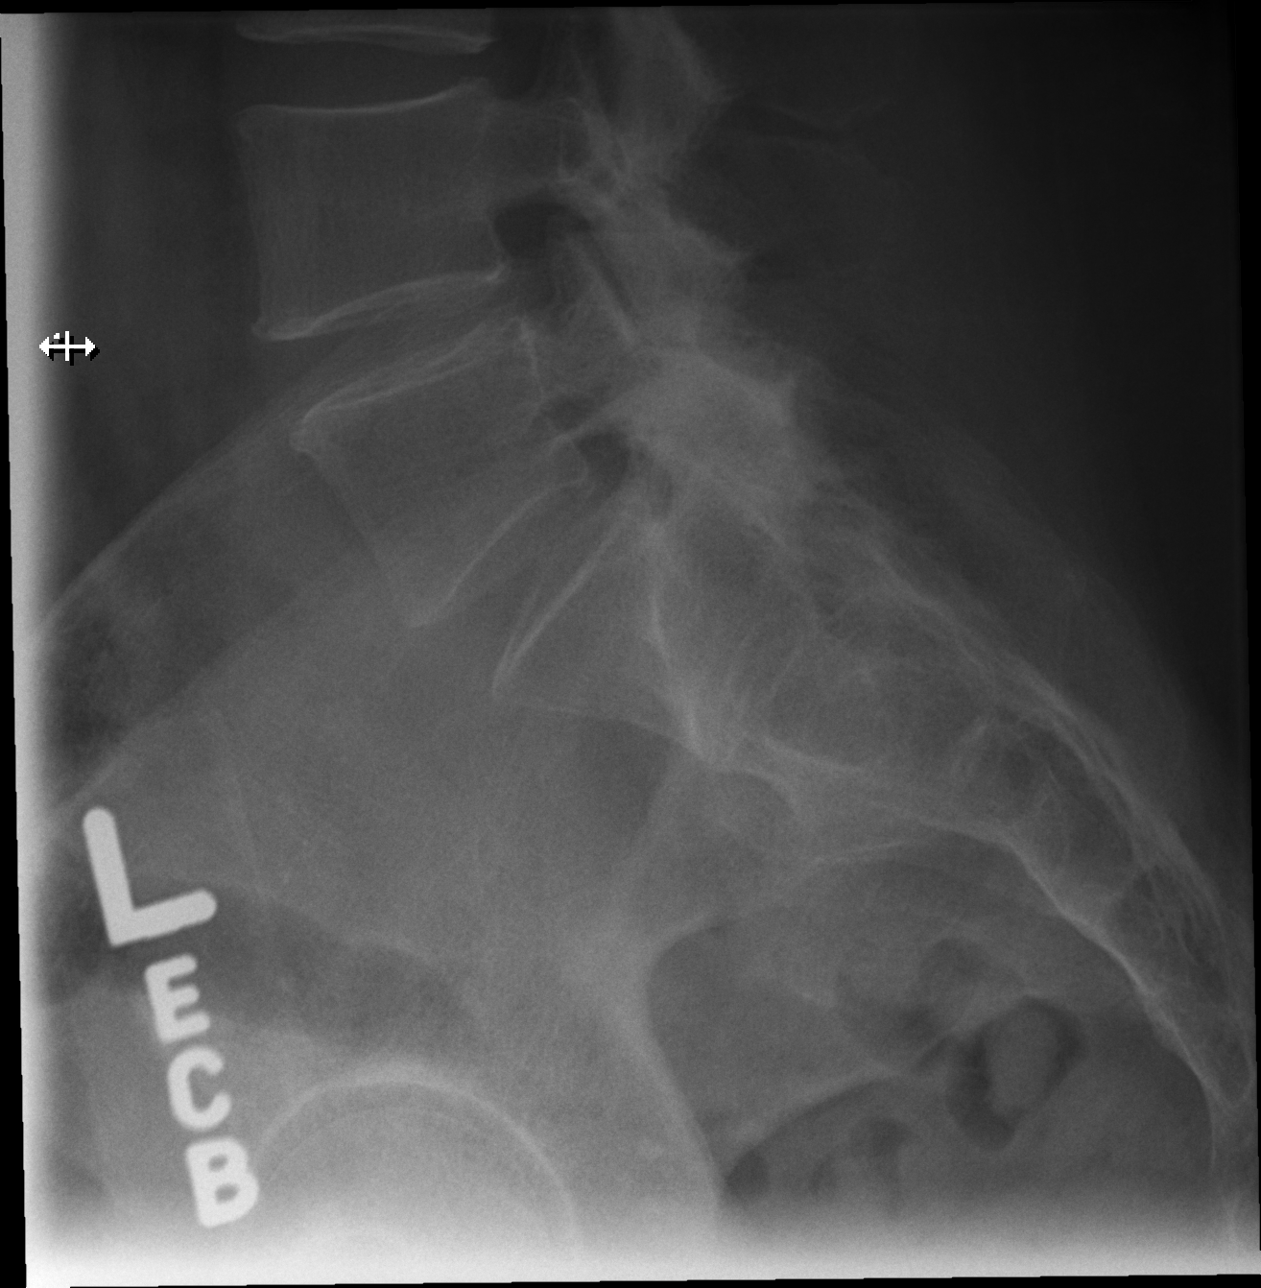

[5 of 5 positions shown; findings below may reference images not displayed]

FINDINGS: Lumbar alignment within normal limits. Vertebral body heights are
normal. Moderate degenerative changes at L1-L2 and L2-L3.
IMPRESSION: Degenerative changes.  No acute osseous abnormality.

## 2019-08-28 DIAGNOSIS — E559 Vitamin D deficiency, unspecified: Secondary | ICD-10-CM | POA: Diagnosis not present

## 2019-08-28 DIAGNOSIS — E669 Obesity, unspecified: Secondary | ICD-10-CM | POA: Diagnosis not present

## 2019-08-28 DIAGNOSIS — R159 Full incontinence of feces: Secondary | ICD-10-CM | POA: Diagnosis not present

## 2019-08-28 DIAGNOSIS — I10 Essential (primary) hypertension: Secondary | ICD-10-CM | POA: Diagnosis not present

## 2019-08-28 DIAGNOSIS — M1710 Unilateral primary osteoarthritis, unspecified knee: Secondary | ICD-10-CM | POA: Diagnosis not present

## 2019-08-28 DIAGNOSIS — Z6837 Body mass index (BMI) 37.0-37.9, adult: Secondary | ICD-10-CM | POA: Diagnosis not present

## 2019-08-28 DIAGNOSIS — F418 Other specified anxiety disorders: Secondary | ICD-10-CM | POA: Diagnosis not present

## 2019-08-28 DIAGNOSIS — R32 Unspecified urinary incontinence: Secondary | ICD-10-CM | POA: Diagnosis not present

## 2019-08-28 DIAGNOSIS — R739 Hyperglycemia, unspecified: Secondary | ICD-10-CM | POA: Diagnosis not present

## 2019-08-28 DIAGNOSIS — R600 Localized edema: Secondary | ICD-10-CM | POA: Diagnosis not present

## 2019-08-28 DIAGNOSIS — J309 Allergic rhinitis, unspecified: Secondary | ICD-10-CM | POA: Diagnosis not present

## 2019-08-28 DIAGNOSIS — E785 Hyperlipidemia, unspecified: Secondary | ICD-10-CM | POA: Diagnosis not present

## 2019-09-07 DIAGNOSIS — Z96651 Presence of right artificial knee joint: Secondary | ICD-10-CM | POA: Diagnosis not present

## 2019-09-07 DIAGNOSIS — Z471 Aftercare following joint replacement surgery: Secondary | ICD-10-CM | POA: Diagnosis not present

## 2019-10-01 DIAGNOSIS — Z20828 Contact with and (suspected) exposure to other viral communicable diseases: Secondary | ICD-10-CM | POA: Diagnosis not present

## 2019-10-01 DIAGNOSIS — J189 Pneumonia, unspecified organism: Secondary | ICD-10-CM | POA: Diagnosis not present

## 2019-12-01 DIAGNOSIS — E559 Vitamin D deficiency, unspecified: Secondary | ICD-10-CM | POA: Diagnosis not present

## 2019-12-01 DIAGNOSIS — M1710 Unilateral primary osteoarthritis, unspecified knee: Secondary | ICD-10-CM | POA: Diagnosis not present

## 2019-12-01 DIAGNOSIS — E669 Obesity, unspecified: Secondary | ICD-10-CM | POA: Diagnosis not present

## 2019-12-01 DIAGNOSIS — R600 Localized edema: Secondary | ICD-10-CM | POA: Diagnosis not present

## 2019-12-01 DIAGNOSIS — R739 Hyperglycemia, unspecified: Secondary | ICD-10-CM | POA: Diagnosis not present

## 2019-12-01 DIAGNOSIS — E785 Hyperlipidemia, unspecified: Secondary | ICD-10-CM | POA: Diagnosis not present

## 2019-12-01 DIAGNOSIS — R159 Full incontinence of feces: Secondary | ICD-10-CM | POA: Diagnosis not present

## 2019-12-01 DIAGNOSIS — F418 Other specified anxiety disorders: Secondary | ICD-10-CM | POA: Diagnosis not present

## 2019-12-01 DIAGNOSIS — Z6837 Body mass index (BMI) 37.0-37.9, adult: Secondary | ICD-10-CM | POA: Diagnosis not present

## 2019-12-01 DIAGNOSIS — J309 Allergic rhinitis, unspecified: Secondary | ICD-10-CM | POA: Diagnosis not present

## 2019-12-01 DIAGNOSIS — I10 Essential (primary) hypertension: Secondary | ICD-10-CM | POA: Diagnosis not present

## 2019-12-01 DIAGNOSIS — R32 Unspecified urinary incontinence: Secondary | ICD-10-CM | POA: Diagnosis not present

## 2020-03-05 DIAGNOSIS — Z0000 Encounter for general adult medical examination without abnormal findings: Secondary | ICD-10-CM | POA: Diagnosis not present

## 2020-03-05 DIAGNOSIS — Z9181 History of falling: Secondary | ICD-10-CM | POA: Diagnosis not present

## 2020-03-05 DIAGNOSIS — Z1331 Encounter for screening for depression: Secondary | ICD-10-CM | POA: Diagnosis not present

## 2020-03-05 DIAGNOSIS — E785 Hyperlipidemia, unspecified: Secondary | ICD-10-CM | POA: Diagnosis not present

## 2020-03-05 DIAGNOSIS — E669 Obesity, unspecified: Secondary | ICD-10-CM | POA: Diagnosis not present

## 2020-03-11 DIAGNOSIS — Z2821 Immunization not carried out because of patient refusal: Secondary | ICD-10-CM | POA: Diagnosis not present

## 2020-03-11 DIAGNOSIS — J019 Acute sinusitis, unspecified: Secondary | ICD-10-CM | POA: Diagnosis not present

## 2020-03-11 DIAGNOSIS — J209 Acute bronchitis, unspecified: Secondary | ICD-10-CM | POA: Diagnosis not present

## 2020-03-11 DIAGNOSIS — Z6837 Body mass index (BMI) 37.0-37.9, adult: Secondary | ICD-10-CM | POA: Diagnosis not present

## 2020-03-11 DIAGNOSIS — J309 Allergic rhinitis, unspecified: Secondary | ICD-10-CM | POA: Diagnosis not present

## 2020-04-10 DIAGNOSIS — F418 Other specified anxiety disorders: Secondary | ICD-10-CM | POA: Diagnosis not present

## 2020-04-10 DIAGNOSIS — E785 Hyperlipidemia, unspecified: Secondary | ICD-10-CM | POA: Diagnosis not present

## 2020-04-10 DIAGNOSIS — J309 Allergic rhinitis, unspecified: Secondary | ICD-10-CM | POA: Diagnosis not present

## 2020-04-10 DIAGNOSIS — R32 Unspecified urinary incontinence: Secondary | ICD-10-CM | POA: Diagnosis not present

## 2020-04-10 DIAGNOSIS — R159 Full incontinence of feces: Secondary | ICD-10-CM | POA: Diagnosis not present

## 2020-04-10 DIAGNOSIS — M1710 Unilateral primary osteoarthritis, unspecified knee: Secondary | ICD-10-CM | POA: Diagnosis not present

## 2020-04-10 DIAGNOSIS — R600 Localized edema: Secondary | ICD-10-CM | POA: Diagnosis not present

## 2020-04-10 DIAGNOSIS — E559 Vitamin D deficiency, unspecified: Secondary | ICD-10-CM | POA: Diagnosis not present

## 2020-04-10 DIAGNOSIS — K219 Gastro-esophageal reflux disease without esophagitis: Secondary | ICD-10-CM | POA: Diagnosis not present

## 2020-04-10 DIAGNOSIS — I10 Essential (primary) hypertension: Secondary | ICD-10-CM | POA: Diagnosis not present

## 2020-04-10 DIAGNOSIS — Z79899 Other long term (current) drug therapy: Secondary | ICD-10-CM | POA: Diagnosis not present

## 2020-04-10 DIAGNOSIS — R739 Hyperglycemia, unspecified: Secondary | ICD-10-CM | POA: Diagnosis not present

## 2020-07-19 DIAGNOSIS — Z6837 Body mass index (BMI) 37.0-37.9, adult: Secondary | ICD-10-CM | POA: Diagnosis not present

## 2020-07-19 DIAGNOSIS — J209 Acute bronchitis, unspecified: Secondary | ICD-10-CM | POA: Diagnosis not present

## 2020-07-19 DIAGNOSIS — J019 Acute sinusitis, unspecified: Secondary | ICD-10-CM | POA: Diagnosis not present

## 2020-07-19 DIAGNOSIS — J309 Allergic rhinitis, unspecified: Secondary | ICD-10-CM | POA: Diagnosis not present

## 2020-07-22 DIAGNOSIS — E785 Hyperlipidemia, unspecified: Secondary | ICD-10-CM | POA: Diagnosis not present

## 2020-07-22 DIAGNOSIS — I10 Essential (primary) hypertension: Secondary | ICD-10-CM | POA: Diagnosis not present

## 2020-07-22 DIAGNOSIS — E669 Obesity, unspecified: Secondary | ICD-10-CM | POA: Diagnosis not present

## 2020-07-22 DIAGNOSIS — J309 Allergic rhinitis, unspecified: Secondary | ICD-10-CM | POA: Diagnosis not present

## 2020-07-22 DIAGNOSIS — R32 Unspecified urinary incontinence: Secondary | ICD-10-CM | POA: Diagnosis not present

## 2020-07-22 DIAGNOSIS — K219 Gastro-esophageal reflux disease without esophagitis: Secondary | ICD-10-CM | POA: Diagnosis not present

## 2020-07-22 DIAGNOSIS — R159 Full incontinence of feces: Secondary | ICD-10-CM | POA: Diagnosis not present

## 2020-07-22 DIAGNOSIS — M1710 Unilateral primary osteoarthritis, unspecified knee: Secondary | ICD-10-CM | POA: Diagnosis not present

## 2020-07-22 DIAGNOSIS — R600 Localized edema: Secondary | ICD-10-CM | POA: Diagnosis not present

## 2020-07-22 DIAGNOSIS — R739 Hyperglycemia, unspecified: Secondary | ICD-10-CM | POA: Diagnosis not present

## 2020-07-22 DIAGNOSIS — E559 Vitamin D deficiency, unspecified: Secondary | ICD-10-CM | POA: Diagnosis not present

## 2020-07-22 DIAGNOSIS — F418 Other specified anxiety disorders: Secondary | ICD-10-CM | POA: Diagnosis not present

## 2020-10-23 DIAGNOSIS — R739 Hyperglycemia, unspecified: Secondary | ICD-10-CM | POA: Diagnosis not present

## 2020-10-23 DIAGNOSIS — F418 Other specified anxiety disorders: Secondary | ICD-10-CM | POA: Diagnosis not present

## 2020-10-23 DIAGNOSIS — E669 Obesity, unspecified: Secondary | ICD-10-CM | POA: Diagnosis not present

## 2020-10-23 DIAGNOSIS — E559 Vitamin D deficiency, unspecified: Secondary | ICD-10-CM | POA: Diagnosis not present

## 2020-10-23 DIAGNOSIS — R600 Localized edema: Secondary | ICD-10-CM | POA: Diagnosis not present

## 2020-10-23 DIAGNOSIS — I10 Essential (primary) hypertension: Secondary | ICD-10-CM | POA: Diagnosis not present

## 2020-10-23 DIAGNOSIS — E785 Hyperlipidemia, unspecified: Secondary | ICD-10-CM | POA: Diagnosis not present

## 2020-10-23 DIAGNOSIS — R159 Full incontinence of feces: Secondary | ICD-10-CM | POA: Diagnosis not present

## 2020-10-23 DIAGNOSIS — K219 Gastro-esophageal reflux disease without esophagitis: Secondary | ICD-10-CM | POA: Diagnosis not present

## 2020-10-23 DIAGNOSIS — M1710 Unilateral primary osteoarthritis, unspecified knee: Secondary | ICD-10-CM | POA: Diagnosis not present

## 2020-10-23 DIAGNOSIS — R32 Unspecified urinary incontinence: Secondary | ICD-10-CM | POA: Diagnosis not present

## 2020-10-23 DIAGNOSIS — J309 Allergic rhinitis, unspecified: Secondary | ICD-10-CM | POA: Diagnosis not present

## 2020-10-28 DIAGNOSIS — R739 Hyperglycemia, unspecified: Secondary | ICD-10-CM | POA: Diagnosis not present

## 2020-10-28 DIAGNOSIS — E559 Vitamin D deficiency, unspecified: Secondary | ICD-10-CM | POA: Diagnosis not present

## 2020-10-28 DIAGNOSIS — Z79899 Other long term (current) drug therapy: Secondary | ICD-10-CM | POA: Diagnosis not present

## 2020-10-28 DIAGNOSIS — E785 Hyperlipidemia, unspecified: Secondary | ICD-10-CM | POA: Diagnosis not present

## 2021-02-07 DIAGNOSIS — F418 Other specified anxiety disorders: Secondary | ICD-10-CM | POA: Diagnosis not present

## 2021-02-07 DIAGNOSIS — E559 Vitamin D deficiency, unspecified: Secondary | ICD-10-CM | POA: Diagnosis not present

## 2021-02-07 DIAGNOSIS — R32 Unspecified urinary incontinence: Secondary | ICD-10-CM | POA: Diagnosis not present

## 2021-02-07 DIAGNOSIS — I10 Essential (primary) hypertension: Secondary | ICD-10-CM | POA: Diagnosis not present

## 2021-02-07 DIAGNOSIS — R600 Localized edema: Secondary | ICD-10-CM | POA: Diagnosis not present

## 2021-02-07 DIAGNOSIS — R739 Hyperglycemia, unspecified: Secondary | ICD-10-CM | POA: Diagnosis not present

## 2021-02-07 DIAGNOSIS — Z6837 Body mass index (BMI) 37.0-37.9, adult: Secondary | ICD-10-CM | POA: Diagnosis not present

## 2021-02-07 DIAGNOSIS — E669 Obesity, unspecified: Secondary | ICD-10-CM | POA: Diagnosis not present

## 2021-02-07 DIAGNOSIS — M1710 Unilateral primary osteoarthritis, unspecified knee: Secondary | ICD-10-CM | POA: Diagnosis not present

## 2021-02-07 DIAGNOSIS — J309 Allergic rhinitis, unspecified: Secondary | ICD-10-CM | POA: Diagnosis not present

## 2021-02-07 DIAGNOSIS — E785 Hyperlipidemia, unspecified: Secondary | ICD-10-CM | POA: Diagnosis not present

## 2021-02-07 DIAGNOSIS — R159 Full incontinence of feces: Secondary | ICD-10-CM | POA: Diagnosis not present

## 2021-02-27 DIAGNOSIS — Z6837 Body mass index (BMI) 37.0-37.9, adult: Secondary | ICD-10-CM | POA: Diagnosis not present

## 2021-02-27 DIAGNOSIS — J019 Acute sinusitis, unspecified: Secondary | ICD-10-CM | POA: Diagnosis not present

## 2021-02-27 DIAGNOSIS — J209 Acute bronchitis, unspecified: Secondary | ICD-10-CM | POA: Diagnosis not present

## 2021-04-03 DIAGNOSIS — Z20822 Contact with and (suspected) exposure to covid-19: Secondary | ICD-10-CM | POA: Diagnosis not present

## 2021-04-09 DIAGNOSIS — L820 Inflamed seborrheic keratosis: Secondary | ICD-10-CM | POA: Diagnosis not present

## 2021-04-09 DIAGNOSIS — L570 Actinic keratosis: Secondary | ICD-10-CM | POA: Diagnosis not present

## 2021-04-09 DIAGNOSIS — L298 Other pruritus: Secondary | ICD-10-CM | POA: Diagnosis not present

## 2021-04-09 DIAGNOSIS — L821 Other seborrheic keratosis: Secondary | ICD-10-CM | POA: Diagnosis not present

## 2021-04-09 DIAGNOSIS — L538 Other specified erythematous conditions: Secondary | ICD-10-CM | POA: Diagnosis not present

## 2021-04-09 DIAGNOSIS — L718 Other rosacea: Secondary | ICD-10-CM | POA: Diagnosis not present

## 2021-04-09 DIAGNOSIS — Z85828 Personal history of other malignant neoplasm of skin: Secondary | ICD-10-CM | POA: Diagnosis not present

## 2021-04-09 DIAGNOSIS — Z08 Encounter for follow-up examination after completed treatment for malignant neoplasm: Secondary | ICD-10-CM | POA: Diagnosis not present

## 2021-05-02 DIAGNOSIS — Z20822 Contact with and (suspected) exposure to covid-19: Secondary | ICD-10-CM | POA: Diagnosis not present

## 2021-05-14 DIAGNOSIS — R159 Full incontinence of feces: Secondary | ICD-10-CM | POA: Diagnosis not present

## 2021-05-14 DIAGNOSIS — E559 Vitamin D deficiency, unspecified: Secondary | ICD-10-CM | POA: Diagnosis not present

## 2021-05-14 DIAGNOSIS — E785 Hyperlipidemia, unspecified: Secondary | ICD-10-CM | POA: Diagnosis not present

## 2021-05-14 DIAGNOSIS — K219 Gastro-esophageal reflux disease without esophagitis: Secondary | ICD-10-CM | POA: Diagnosis not present

## 2021-05-14 DIAGNOSIS — Z2821 Immunization not carried out because of patient refusal: Secondary | ICD-10-CM | POA: Diagnosis not present

## 2021-05-14 DIAGNOSIS — R32 Unspecified urinary incontinence: Secondary | ICD-10-CM | POA: Diagnosis not present

## 2021-05-14 DIAGNOSIS — M1710 Unilateral primary osteoarthritis, unspecified knee: Secondary | ICD-10-CM | POA: Diagnosis not present

## 2021-05-14 DIAGNOSIS — R600 Localized edema: Secondary | ICD-10-CM | POA: Diagnosis not present

## 2021-05-14 DIAGNOSIS — I10 Essential (primary) hypertension: Secondary | ICD-10-CM | POA: Diagnosis not present

## 2021-05-14 DIAGNOSIS — J309 Allergic rhinitis, unspecified: Secondary | ICD-10-CM | POA: Diagnosis not present

## 2021-05-14 DIAGNOSIS — R739 Hyperglycemia, unspecified: Secondary | ICD-10-CM | POA: Diagnosis not present

## 2021-05-14 DIAGNOSIS — F418 Other specified anxiety disorders: Secondary | ICD-10-CM | POA: Diagnosis not present

## 2021-05-17 DIAGNOSIS — Z20822 Contact with and (suspected) exposure to covid-19: Secondary | ICD-10-CM | POA: Diagnosis not present

## 2021-05-19 DIAGNOSIS — Z20822 Contact with and (suspected) exposure to covid-19: Secondary | ICD-10-CM | POA: Diagnosis not present

## 2021-05-23 DIAGNOSIS — K529 Noninfective gastroenteritis and colitis, unspecified: Secondary | ICD-10-CM | POA: Diagnosis not present

## 2021-07-07 DIAGNOSIS — H26491 Other secondary cataract, right eye: Secondary | ICD-10-CM | POA: Diagnosis not present

## 2021-07-11 DIAGNOSIS — E669 Obesity, unspecified: Secondary | ICD-10-CM | POA: Diagnosis not present

## 2021-07-11 DIAGNOSIS — E785 Hyperlipidemia, unspecified: Secondary | ICD-10-CM | POA: Diagnosis not present

## 2021-07-11 DIAGNOSIS — I10 Essential (primary) hypertension: Secondary | ICD-10-CM | POA: Diagnosis not present

## 2021-07-11 DIAGNOSIS — Z6837 Body mass index (BMI) 37.0-37.9, adult: Secondary | ICD-10-CM | POA: Diagnosis not present

## 2021-07-11 DIAGNOSIS — M1710 Unilateral primary osteoarthritis, unspecified knee: Secondary | ICD-10-CM | POA: Diagnosis not present

## 2021-07-11 DIAGNOSIS — R739 Hyperglycemia, unspecified: Secondary | ICD-10-CM | POA: Diagnosis not present

## 2021-07-11 DIAGNOSIS — E559 Vitamin D deficiency, unspecified: Secondary | ICD-10-CM | POA: Diagnosis not present

## 2021-07-17 DIAGNOSIS — Z79899 Other long term (current) drug therapy: Secondary | ICD-10-CM | POA: Diagnosis not present

## 2021-07-17 DIAGNOSIS — E785 Hyperlipidemia, unspecified: Secondary | ICD-10-CM | POA: Diagnosis not present

## 2021-07-17 DIAGNOSIS — R739 Hyperglycemia, unspecified: Secondary | ICD-10-CM | POA: Diagnosis not present

## 2021-07-17 DIAGNOSIS — E559 Vitamin D deficiency, unspecified: Secondary | ICD-10-CM | POA: Diagnosis not present

## 2021-09-29 DIAGNOSIS — Z6837 Body mass index (BMI) 37.0-37.9, adult: Secondary | ICD-10-CM | POA: Diagnosis not present

## 2021-09-29 DIAGNOSIS — S91109A Unspecified open wound of unspecified toe(s) without damage to nail, initial encounter: Secondary | ICD-10-CM | POA: Diagnosis not present

## 2021-10-08 DIAGNOSIS — E669 Obesity, unspecified: Secondary | ICD-10-CM | POA: Diagnosis not present

## 2021-10-08 DIAGNOSIS — Z0000 Encounter for general adult medical examination without abnormal findings: Secondary | ICD-10-CM | POA: Diagnosis not present

## 2021-10-08 DIAGNOSIS — E785 Hyperlipidemia, unspecified: Secondary | ICD-10-CM | POA: Diagnosis not present

## 2021-10-08 DIAGNOSIS — Z1331 Encounter for screening for depression: Secondary | ICD-10-CM | POA: Diagnosis not present

## 2021-10-08 DIAGNOSIS — Z6835 Body mass index (BMI) 35.0-35.9, adult: Secondary | ICD-10-CM | POA: Diagnosis not present

## 2021-10-22 ENCOUNTER — Encounter: Payer: Self-pay | Admitting: Obstetrics and Gynecology

## 2021-10-22 ENCOUNTER — Other Ambulatory Visit (HOSPITAL_COMMUNITY)
Admission: RE | Admit: 2021-10-22 | Discharge: 2021-10-22 | Disposition: A | Discharge status: Home / Self Care | Payer: Medicare Other | Source: Ambulatory Visit | Attending: Obstetrics and Gynecology | Admitting: Obstetrics and Gynecology

## 2021-10-22 ENCOUNTER — Ambulatory Visit (INDEPENDENT_AMBULATORY_CARE_PROVIDER_SITE_OTHER): Payer: Medicare Other | Admitting: Obstetrics and Gynecology

## 2021-10-22 VITALS — BP 106/70 | HR 54 | Ht 58.5 in | Wt 190.0 lb

## 2021-10-22 DIAGNOSIS — L900 Lichen sclerosus et atrophicus: Secondary | ICD-10-CM | POA: Diagnosis not present

## 2021-10-22 DIAGNOSIS — N3281 Overactive bladder: Secondary | ICD-10-CM

## 2021-10-22 DIAGNOSIS — N898 Other specified noninflammatory disorders of vagina: Secondary | ICD-10-CM

## 2021-10-22 DIAGNOSIS — R350 Frequency of micturition: Secondary | ICD-10-CM

## 2021-10-22 DIAGNOSIS — N393 Stress incontinence (female) (male): Secondary | ICD-10-CM

## 2021-10-22 DIAGNOSIS — R159 Full incontinence of feces: Secondary | ICD-10-CM

## 2021-10-22 MED ORDER — CLOBETASOL PROP EMOLLIENT BASE 0.05 % EX CREA: 1.0000 g | TOPICAL_CREAM | Freq: Two times a day (BID) | CUTANEOUS | 4 refills | Status: DC

## 2021-10-22 MED ORDER — MIRABEGRON ER 25 MG PO TB24: 25.0000 mg | ORAL_TABLET | Freq: Every day | ORAL | 5 refills | Status: DC

## 2021-10-22 NOTE — Progress Notes (Signed)
[name] New Patient Evaluation and Consultation  Referring Provider: Nicholos Johns, MD PCP: Nicholos Johns, MD Date of Service: 10/22/2021  SUBJECTIVE Chief Complaint: New Patient (Initial Visit) Janet Diaz is a 76 y.o. female complains of feels like she has something is in her vagina. Pt said she has discharge.)  History of Present Illness: Janet Diaz is a 76 y.o. White or Caucasian female seen in consultation at the request of Dr. Rica Records for evaluation of incontinence.    Review of records from Dr Rica Records significant for: Has bowel and bladder incontinence. Has trialed oxybutynin.   Urinary Symptoms: Leaks urine with cough/ sneeze, laughing, exercise, lifting, going from sitting to standing, with a full bladder, with movement to the bathroom, and with urgency UUI > SUI Leaks 3-6 time(s) per day Pad use: 3- 5 adult diapers per day.   She is bothered by her UI symptoms. No prior treatments for her bladder. She was prescribed oxybutynin but was concerned about side effects so did not take it.   Day time voids 5-6.  Nocturia: 0 times per night to void. Voiding dysfunction: she empties her bladder well.  does not use a catheter to empty bladder.  When urinating, she feels she has no difficulties Drinks: 3 cups coffee, 24oz water   UTIs:  0  UTI's in the last year.   Denies history of blood in urine and kidney or bladder stones  Pelvic Organ Prolapse Symptoms:                  She Denies a feeling of a bulge the vaginal area.   Bowel Symptom: Bowel movements: 3 time(s) per day Stool consistency: soft  or loose Straining: no.  Splinting: no.  Incomplete evacuation: yes.  She Admits to accidental bowel leakage / fecal incontinence  Occurs: occasionally  Consistency with leakage: liquid Bowel regimen: none   Sexual Function Sexually active: no- widowed   Pelvic Pain Admits to pelvic pain Location: in the vagina and surrounding area,  Pain occurs: can  last for weeks Prior pain treatment: none  Needs to scratch on the vagina/ vulva.  She is using fragrence free wipes Sometimes uses dial body wash (lavender) on the vulva. Not taking tub baths.    Past Medical History:  Past Medical History:  Diagnosis Date   Acute bronchitis    Allergic rhinosinusitis    Anxiety and depression    Candida infection of flexural skin    Carpal tunnel syndrome    Complication of anesthesia    hard time waking up , And BP went upwith a finger surgery,   Dyslipidemia    GERD (gastroesophageal reflux disease)    Laryngitis    OAB (overactive bladder)    Osteoarthritis of right knee    Pedal edema    Vitamin B 12 deficiency    Vitamin D deficiency      Past Surgical History:   Past Surgical History:  Procedure Laterality Date   ABDOMINAL HYSTERECTOMY  1975   CARPAL TUNNEL RELEASE  2000   CATARACT EXTRACTION  2014   FINGER SURGERY  2016   JOINT REPLACEMENT     Right total knee Dr. Maureen Ralphs  6/24    [place]   has ovaries   torn cartilage     TOTAL KNEE ARTHROPLASTY Right 07/05/2017   Procedure: RIGHT TOTAL KNEE ARTHROPLASTY;  Surgeon: Gaynelle Arabian, MD;  Location: [facility];  Service:  Orthopedics;  Laterality: Right;   WRIST SURGERY Left 2014     Past OB/GYN History: OB History  Gravida Para Term Preterm AB Living  '2 2 2     2  '$ SAB IAB Ectopic Multiple Live Births          2    # Outcome Date GA Lbr Len/2nd Weight Sex Delivery Anes PTL Lv  2 Term      Vag-Spont     1 Term      Vag-Spont       S/p hysterectomy   Medications: She has a current medication list which includes the following prescription(s): aspirin ec, citalopram, clobetasol prop emollient base, ergocalciferol, fluticasone, furosemide, loratadine, meclizine, mirabegron er, montelukast, pantoprazole, and potassium chloride.   Allergies: Patient is allergic to omeprazole and ampicillin.   Social History:  Social History   Tobacco  Use   Smoking status: Never   Smokeless tobacco: Never  Vaping Use   Vaping Use: Never used  Substance Use Topics   Alcohol use: No   Drug use: No    Relationship status: widowed She lives with alone with pets.   She is not employed. Regular exercise: No History of abuse: No  Family History:   Family History  Problem Relation Age of Onset   Heart disease Mother    Hypertension Mother    Stroke Mother    Heart attack Mother    Prostate cancer Paternal Grandfather      Review of Systems: Review of Systems  Constitutional:  Negative for fever, malaise/fatigue and weight loss.  Respiratory:  Positive for shortness of breath. Negative for cough and wheezing.   Cardiovascular:  Positive for leg swelling. Negative for chest pain and palpitations.  Gastrointestinal:  Positive for blood in stool. Negative for abdominal pain.  Genitourinary:  Positive for dysuria.  Musculoskeletal:  Positive for myalgias.  Skin:  Negative for rash.  Neurological:  Positive for dizziness. Negative for headaches.  Endo/Heme/Allergies:  Bruises/bleeds easily.  Psychiatric/Behavioral:  Negative for depression. The patient is not nervous/anxious.      OBJECTIVE Physical Exam: Vitals:   10/22/21 1430  BP: 106/70  Pulse: (!) 54  Weight: 190 lb (86.2 kg)  Height: 4' 10.5" (1.486 m)    Physical Exam Constitutional:      General: She is not in acute distress. Pulmonary:     Effort: Pulmonary effort is normal.  Abdominal:     General: There is no distension.     Palpations: Abdomen is soft.     Tenderness: There is no abdominal tenderness. There is no rebound.  Genitourinary:   Musculoskeletal:        General: No swelling. Normal range of motion.  Skin:    General: Skin is warm and dry.     Findings: No rash.  Neurological:     Mental Status: She is alert and oriented to person, place, and time.  Psychiatric:        Mood and Affect: Mood normal.        Behavior: Behavior normal.       GU / Detailed Urogynecologic Evaluation:  Pelvic Exam: Vulva with demarcated erythema (see chart above), white- thin skin over clitoral hood, at introitus and near the rectum, fissures present from scratching.  Bartholin's and Skene's glands normal in appearance; urethral meatus normal in appearance, no urethral masses or discharge.   CST: negative  s/p hysterectomy: Speculum exam reveals normal vaginal mucosa with  atrophy and normal vaginal cuff.  Adnexa no mass, fullness, tenderness.     Pelvic floor strength I/V, puborectalis II/V external anal sphincter III/V  Pelvic floor musculature: Right levator tender, Right obturator non-tender, Left levator tender, Left obturator non-tender  POP-Q:   POP-Q  -3                                            Aa   -3                                           Ba  -5.5                                              C   2.5                                            Gh  3.5                                            Pb  7                                            tvl   -2.5                                            Ap  -2.5                                            Bp                                                 D     Rectal Exam:  Normal sphincter tone, small distal rectocele, enterocoele not present, no rectal masses, no sign of dyssynergia when asking the patient to bear down.  Post-Void Residual (PVR) by Bladder Scan: In order to evaluate bladder emptying, we discussed obtaining a postvoid residual and she agreed to this procedure.  Procedure: The ultrasound unit was placed on the patient's abdomen in the suprapubic region after the patient had voided. A PVR of 9 ml was obtained by bladder scan.  Laboratory Results: POC urine:   ASSESSMENT AND PLAN Ms. Needle is a 76 y.o. with:  1. Overactive bladder   2. Urinary frequency   3. SUI (stress urinary incontinence, female)   4. Lichen sclerosus   5. Vaginal  itching   6. Incontinence of feces, unspecified fecal incontinence  type    OAB - We discussed the symptoms of overactive bladder (OAB), which include urinary urgency, urinary frequency, nocturia, with or without urge incontinence.  While we do not know the exact etiology of OAB, several treatment options exist. We discussed management including behavioral therapy (decreasing bladder irritants, urge suppression strategies, timed voids, bladder retraining), physical therapy, medication; for refractory cases posterior tibial nerve stimulation, sacral neuromodulation, and intravesical botulinum toxin injection.  - She will work on decreasing caffeine intake.  - Prescribed Myrbetriq '25mg'$  and samples provided. For Beta-3 agonist medication, we discussed the potential side effect of elevated blood pressure which is more likely to occur in individuals with uncontrolled hypertension. - She is also interested in physical therapy. Referral placed to [facility]  2. SUI - not as bothersome - Plan for pelvic PT  3. 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  - Also sent aptima swab to rule out infection - Avoid soap on the vulva  4. Accidental Bowel Leakage:  - Treatment options include anti-diarrhea medication (loperamide/ Imodium OTC or prescription lomotil), fiber supplements, physical therapy, and possible sacral neuromodulation or surgery.   - Discussed adding psyllium fiber supplement daily to help with stool bulking  Return 6 weeks   Jaquita Folds, MD

## 2021-10-22 NOTE — Patient Instructions (Addendum)
For the clobetasol cream- use 0.5g-1g twice a day for month. Then once a day for a month. Then 2-3 times per week.   Today we talked about ways to manage bladder urgency such as altering your diet to avoid irritative beverages and foods (bladder diet) as well as attempting to decrease stress and other exacerbating factors.   The Most Bothersome Foods* The Least Bothersome Foods*  Coffee - Regular & Decaf Tea - caffeinated Carbonated beverages - cola, non-colas, diet & caffeine-free Alcohols - Beer, Red Wine, White Wine, Champagne Fruits - Grapefruit, Freeman, Orange, [employer] - Cranberry, Grapefruit, Orange, Pineapple Vegetables - Tomato & Tomato Products Flavor Enhancers - Hot peppers, Spicy foods, Chili, Horseradish, Vinegar, Monosodium glutamate (MSG) Artificial Sweeteners - NutraSweet, Sweet 'N Low, Equal (sweetener), Saccharin Ethnic foods - [place], [place], [place] food [employer] - low-fat & whole Fruits - Bananas, Blueberries, Honeydew melon, Pears, Raisins, Watermelon Vegetables - Broccoli, Brussels Sprouts, Reed, Carrots, Cauliflower, [place]er[place], Cucumber, Mushrooms, Peas, Radishes, Squash, Zucchini, White potatoes, Sweet potatoes & yams Poultry - Chicken, Eggs, [place], [employer] - Beef, Programmer, multimedia, Lamb Seafood - Shrimp, Curlew fish, Salmon Grains - Oat, Rice Snacks - Pretzels, Popcorn  *Lissa Morales et al. Diet and its role in interstitial cystitis/bladder pain syndrome (IC/BPS) and comorbid conditions. [place] 2012 Jan 11.   Accidental Bowel Leakage: Our goal is to achieve formed bowel movements daily or every-other-day without leakage.  You may need to try different combinations of the following options to find what works best for you.  Some management options include: Dietary changes (more leafy greens, vegetables and fruits; less processed foods) Fiber supplementation (Metamucil or something with psyllium as active ingredient) Over-the-counter imodium  (tablets or liquid) to help solidify the stool and prevent leakage of stool.

## 2021-10-23 LAB — POCT URINALYSIS DIPSTICK
Bilirubin, UA: NEGATIVE
Blood, UA: NEGATIVE
Glucose, UA: NEGATIVE
Ketones, UA: NEGATIVE
Leukocytes, UA: NEGATIVE
Nitrite, UA: NEGATIVE
Protein, UA: NEGATIVE
Spec Grav, UA: 1.015 (ref 1.010–1.025)
Urobilinogen, UA: 0.2 E.U./dL
pH, UA: 6.5 (ref 5.0–8.0)

## 2021-10-23 LAB — CERVICOVAGINAL ANCILLARY ONLY
Bacterial Vaginitis (gardnerella): NEGATIVE
Candida Glabrata: NEGATIVE
Candida Vaginitis: NEGATIVE
Comment: NEGATIVE
Comment: NEGATIVE
Comment: NEGATIVE

## 2021-11-03 DIAGNOSIS — U071 COVID-19: Secondary | ICD-10-CM | POA: Diagnosis not present

## 2021-11-03 DIAGNOSIS — N898 Other specified noninflammatory disorders of vagina: Secondary | ICD-10-CM | POA: Diagnosis not present

## 2021-11-03 DIAGNOSIS — Z6837 Body mass index (BMI) 37.0-37.9, adult: Secondary | ICD-10-CM | POA: Diagnosis not present

## 2021-11-03 DIAGNOSIS — I10 Essential (primary) hypertension: Secondary | ICD-10-CM | POA: Diagnosis not present

## 2021-11-03 DIAGNOSIS — J019 Acute sinusitis, unspecified: Secondary | ICD-10-CM | POA: Diagnosis not present

## 2021-11-17 DIAGNOSIS — H26493 Other secondary cataract, bilateral: Secondary | ICD-10-CM | POA: Diagnosis not present

## 2021-12-12 ENCOUNTER — Ambulatory Visit: Payer: Medicare Other | Admitting: Obstetrics and Gynecology

## 2021-12-22 ENCOUNTER — Ambulatory Visit: Payer: Medicare Other | Admitting: Obstetrics and Gynecology

## 2022-01-16 ENCOUNTER — Ambulatory Visit: Payer: Medicare Other | Admitting: Obstetrics and Gynecology

## 2022-02-27 ENCOUNTER — Ambulatory Visit: Payer: Medicare Other | Admitting: Obstetrics and Gynecology

## 2022-04-13 ENCOUNTER — Telehealth: Payer: Self-pay | Admitting: Obstetrics and Gynecology

## 2022-04-13 NOTE — Telephone Encounter (Signed)
Called and spoke to patient and we discussed she  needs to use her creams and come in for a follow up.

## 2022-04-13 NOTE — Telephone Encounter (Signed)
Patient called today to reschedule appt.  She has rescheduled four times since it was booked.  I asked her if she was taking meds, and she said no.  I asked why and she said it was because of side effects, and was very confused on what she needed to be doing.  Would like a call back to discuss.

## 2022-04-14 ENCOUNTER — Ambulatory Visit: Payer: Medicare Other | Admitting: Obstetrics and Gynecology

## 2022-06-02 ENCOUNTER — Ambulatory Visit
Admission: RE | Admit: 2022-06-02 | Discharge: 2022-06-02 | Disposition: A | Discharge status: Home / Self Care | Payer: Medicare Other | Source: Ambulatory Visit | Attending: Family Medicine | Admitting: Family Medicine

## 2022-06-02 ENCOUNTER — Other Ambulatory Visit: Payer: Self-pay | Admitting: Family Medicine

## 2022-06-02 DIAGNOSIS — R059 Cough, unspecified: Secondary | ICD-10-CM

## 2022-06-11 ENCOUNTER — Ambulatory Visit: Payer: Medicare Other | Admitting: Obstetrics and Gynecology

## 2022-07-02 ENCOUNTER — Ambulatory Visit: Payer: Medicare Other | Admitting: Obstetrics and Gynecology

## 2022-07-31 ENCOUNTER — Ambulatory Visit: Payer: Medicare Other | Admitting: Obstetrics and Gynecology

## 2022-08-03 ENCOUNTER — Encounter: Payer: Self-pay | Admitting: Obstetrics and Gynecology

## 2022-08-03 ENCOUNTER — Ambulatory Visit: Payer: Medicare Other | Admitting: Obstetrics and Gynecology

## 2022-08-03 VITALS — BP 120/70 | HR 57

## 2022-08-03 DIAGNOSIS — B372 Candidiasis of skin and nail: Secondary | ICD-10-CM

## 2022-08-03 DIAGNOSIS — N952 Postmenopausal atrophic vaginitis: Secondary | ICD-10-CM | POA: Diagnosis not present

## 2022-08-03 DIAGNOSIS — L900 Lichen sclerosus et atrophicus: Secondary | ICD-10-CM | POA: Diagnosis not present

## 2022-08-03 MED ORDER — CLOBETASOL PROPIONATE E 0.05 % EX CREA
1.0000 | TOPICAL_CREAM | Freq: Every day | CUTANEOUS | 1 refills | Status: AC
Start: 2022-08-03 — End: 2023-03-31

## 2022-08-03 MED ORDER — NYSTATIN 100000 UNIT/GM EX POWD
1.0000 | Freq: Three times a day (TID) | CUTANEOUS | 0 refills | Status: AC
Start: 2022-08-03 — End: ?

## 2022-08-03 MED ORDER — ESTRADIOL 0.1 MG/GM VA CREA: 0.5000 g | TOPICAL_CREAM | VAGINAL | 11 refills | Status: AC

## 2022-08-03 NOTE — Progress Notes (Signed)
[facility] Return Visit  SUBJECTIVE  History of Present Illness: Janet Diaz is a 77 y.o. female seen in follow-up for FI, Lichen sclerosus, SUI, and OAB. Plan at last visit was start estrogen cream, clobetasol and Myrbetriq.  Patient has not been taking Myrbetriq and is not seeking further treatment from her OAB symptoms.   Has not been using estrogen cream and reports she never got it.   Has been using clobetasol daily for the last few weeks.   She also endorses vocal changes and more shortness of breath.   Past Medical History: Patient  has a past medical history of Acute bronchitis, Allergic rhinosinusitis, Anxiety and depression, Candida infection of flexural skin, Carpal tunnel syndrome, Complication of anesthesia, Dyslipidemia, GERD (gastroesophageal reflux disease), Laryngitis, OAB (overactive bladder), Osteoarthritis of right knee, Pedal edema, Vitamin B 12 deficiency, and Vitamin D deficiency.   Past Surgical History: She  has a past surgical history that includes Knee surgery; Abdominal hysterectomy (1975); Carpal tunnel release (2000); torn cartilage; Cataract extraction (2014); Wrist surgery (Left, 2014); Finger surgery (2016); Partial hysterectomy (1975); Joint replacement; and Total knee arthroplasty (Right, 07/05/2017).   Medications: She has a current medication list which includes the following prescription(s): estradiol, nystatin, aspirin ec, citalopram, clobetasol propionate e, ergocalciferol, fluticasone, furosemide, loratadine, meclizine, montelukast, pantoprazole, and potassium chloride.   Allergies: Patient is allergic to omeprazole and ampicillin.   Social History: Patient  reports that she has never smoked. She has never used smokeless tobacco. She reports that she does not drink alcohol and does not use drugs.      OBJECTIVE     Physical Exam: Vitals:   08/03/22 1547  BP: 120/70  Pulse: (!) 57   Gen: No apparent distress, A&O x  3.  Detailed Urogynecologic Evaluation:  External vaginal exam revealed lichens sclerosus still prevalent around the labia and into the perineal region as well as around the clitoris. She has significant vaginal atrophy as well as vulvar vein distention on the right internal labia.      ASSESSMENT AND PLAN    Janet Diaz is a 77 y.o. with:  1. Lichen sclerosus   2. Yeast infection of the skin   3. Vaginal atrophy    Patient's lichen sclerosus is still inflamed and skin is still raw and irritated looking. Encouraged patient to use this nightly and we used a mirror during exam to show her where to use the clobetasol down in the perineal region. Patient has skin folds under the breast and abdomen that are concerning for skin yeast. Nystatin powder prescribed today.  Estrace cream re-prescribed. We discussed using it nightly for two weeks and then twice a week after that to assist in the vaginal atrophy.   Patient to follow up in 6 months for re-evaluation of symptoms or sooner if needed.

## 2022-08-03 NOTE — Patient Instructions (Signed)
Use the nystatin powder under your breasts for the skin fold yeast  Use the estrogen cream inside the vagina and around the opening where the atrophy has occurred. Use the estrogen cream nightly for 2 weeks and then twice a week after.   Use the clobetasol cream nightly down to the peri-anal area and around the sides of the vagina.

## 2022-09-10 ENCOUNTER — Institutional Professional Consult (permissible substitution): Payer: Medicare Other | Admitting: Internal Medicine

## 2022-10-26 NOTE — H&P (Signed)
TOTAL KNEE ADMISSION H&P  Patient is being admitted for left total knee arthroplasty.  Subjective:  Chief Complaint: Left knee pain.  HPI: Janet Diaz, 77 y.o. female has a history of pain and functional disability in the left knee due to arthritis and has failed non-surgical conservative treatments for greater than 12 weeks to include NSAID's and/or analgesics, corticosteriod injections, and activity modification. Onset of symptoms was gradual, starting 10 years ago with gradually worsening course since that time. The patient noted no past surgery on the left knee.  Patient currently rates pain in the left knee at 8 out of 10 with activity. Patient has night pain, worsening of pain with activity and weight bearing, pain with passive range of motion, and crepitus. Patient has evidence of  bone-on-bone arthritis in the medial and patellofemoral compartments  by imaging studies. There is no active infection.  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    Past Medical History:  Diagnosis Date   Acute bronchitis    Allergic rhinosinusitis    Anxiety and depression    Candida infection of flexural skin    Carpal tunnel syndrome    Complication of anesthesia    hard time waking up , And BP went upwith a finger surgery,   Dyslipidemia    GERD (gastroesophageal reflux disease)    Laryngitis    OAB (overactive bladder)    Osteoarthritis of right knee    Pedal edema    Vitamin B 12 deficiency    Vitamin D deficiency     Past Surgical History:  Procedure Laterality Date    ABDOMINAL HYSTERECTOMY  1975   CARPAL TUNNEL RELEASE  2000   CATARACT EXTRACTION  2014   FINGER SURGERY  2016   JOINT REPLACEMENT     Right total knee Dr. Despina Hick  6/24    KNEE SURGERY     PARTIAL HYSTERECTOMY  1975   has ovaries   torn cartilage     TOTAL KNEE ARTHROPLASTY Right 07/05/2017   Procedure: RIGHT TOTAL KNEE ARTHROPLASTY;  Surgeon: Ollen Gross, MD;  Location: [facility];  Service: Orthopedics;  Laterality: Right;   WRIST SURGERY Left 2014    Prior to Admission medications   Medication Sig Start Date End Date Taking? Authorizing Provider  aspirin EC 325 MG EC tablet Take 1 tablet (325 mg total) by mouth 2 (two) times daily. To prevent blood clots 07/07/17   [place], [facility], PA-C  citalopram (CELEXA) 40 MG tablet Take 40 mg by mouth at bedtime.  05/26/17   [provider]  Clobetasol Prop Emollient Base (CLOBETASOL PROPIONATE E) 0.05 % emollient cream Apply 1 Application topically daily. 08/03/22 03/31/23  Selmer Dominion, NP  ergocalciferol (VITAMIN D2) 50000 units capsule Take 50,000 Units by mouth every Wednesday. AT BEDTIME    [provider]  estradiol (ESTRACE) 0.1 MG/GM vaginal cream Place 0.5 g vaginally 2 (two) times a week. Place 0.5g nightly for two weeks then twice a week after 08/03/22   Selmer Dominion, NP  fluticasone (FLONASE) 50 MCG/ACT nasal spray Place 1 spray into both nostrils  2 (two) times daily.     [provider]  furosemide (LASIX) 20 MG tablet Take 20 mg by mouth daily.     [provider]  loratadine (CLARITIN) 10 MG tablet Take 10 mg by mouth daily.    [provider]  meclizine (ANTIVERT) 12.5 MG tablet Take 1-2 tablets (12.5-25 mg total) by mouth 2 (two) times daily as needed (for vertigo). 07/06/17   Sylvester Salonga L, PA  montelukast (SINGULAIR) 10 MG tablet Take 10 mg by mouth at bedtime.    [provider]  nystatin (MYCOSTATIN/NYSTOP) powder Apply 1 Application topically 3 (three) times  daily. 08/03/22   Selmer Dominion, NP  pantoprazole (PROTONIX) 40 MG tablet Take 40 mg by mouth daily before breakfast.     [provider]  potassium chloride (MICRO-K) 10 MEQ CR capsule Take 10 mEq by mouth daily.     [provider]    Allergies  Allergen Reactions   Omeprazole Itching   Ampicillin Rash    Has patient had a PCN reaction causing immediate rash, facial/tongue/throat swelling, SOB or lightheadedness with hypotension: Unknown Has patient had a PCN reaction causing severe rash involving mucus membranes or skin necrosis: Unknown Has patient had a PCN reaction that required hospitalization: No Has patient had a PCN reaction occurring within the last 10 years: No If all of the above answers are "NO", then may proceed with Cephalosporin use.      Social History   Socioeconomic History   Marital status: Married    Spouse name: Not on file   Number of children: Not on file   Years of education: Not on file   Highest education level: Not on file  Occupational History   Not on file  Tobacco Use   Smoking status: Never   Smokeless tobacco: Never  Vaping Use   Vaping status: Never Used  Substance and Sexual Activity   Alcohol use: No   Drug use: No   Sexual activity: Not Currently  Other Topics Concern   Not on file  Social History Narrative   Not on file   Social Determinants of Health   Financial Resource Strain: Not on file  Food Insecurity: Not on file  Transportation Needs: Not on file  Physical Activity: Not on file  Stress: Not on file  Social Connections: Not on file  Intimate Partner Violence: Not on file    Tobacco Use: Low Risk  (08/03/2022)   Patient History    Smoking Tobacco Use: Never    Smokeless Tobacco Use: Never    Passive Exposure: Not on file   Social History   Substance and Sexual Activity  Alcohol Use No    Family History  Problem Relation Age of Onset   Heart disease Mother    Hypertension Mother     Stroke Mother    Heart attack Mother    Prostate cancer Paternal Grandfather     Review of Systems  Constitutional:  Negative for chills and fever.  HENT:  Negative for congestion, sore throat and tinnitus.   Eyes:  Negative for double vision, photophobia and pain.  Respiratory:  Negative for cough, shortness of breath and wheezing.   Cardiovascular:  Negative for chest pain, palpitations and orthopnea.  Gastrointestinal:  Negative for heartburn, nausea and vomiting.  Genitourinary:  Negative for dysuria, frequency and urgency.  Musculoskeletal:  Positive for joint pain.  Neurological:  Negative for dizziness, weakness and headaches.    Objective:  Physical  Exam: Well nourished and well developed.  General: Alert and oriented x3, cooperative and pleasant, no acute distress.  Head: normocephalic, atraumatic, neck supple.  Eyes: EOMI.  Musculoskeletal:  Left Knee Exam:  Mild tenderness to palpation about the medial joint line of the knee.  AROM 0-130 degrees.  No effusion noted.  No instability.  Mild patellofemoral crepitus.  Varus deformity   Calves soft and nontender. Motor function intact in LE. Strength 5/5 LE bilaterally. Neuro: Distal pulses 2+. Sensation to light touch intact in LE.   Imaging Review Plain radiographs demonstrate severe degenerative joint disease of the left knee. The overall alignment is neutral. The bone quality appears to be adequate for age and reported activity level.  Assessment/Plan:  End stage arthritis, left knee   The patient history, physical examination, clinical judgment of the provider and imaging studies are consistent with end stage degenerative joint disease of the left knee and total knee arthroplasty is deemed medically necessary. The treatment options including medical management, injection therapy arthroscopy and arthroplasty were discussed at length. The risks and benefits of total knee arthroplasty were presented and reviewed.  The risks due to aseptic loosening, infection, stiffness, patella tracking problems, thromboembolic complications and other imponderables were discussed. The patient acknowledged the explanation, agreed to proceed with the plan and consent was signed. Patient is being admitted for inpatient treatment for surgery, pain control, PT, OT, prophylactic antibiotics, VTE prophylaxis, progressive ambulation and ADLs and discharge planning. The patient is planning to be discharged  home .   Patient's anticipated LOS is less than 2 midnights, meeting these requirements: - Lives within 1 hour of care - Has a competent adult at home to recover with post-op recover - NO history of  - Chronic pain requiring opioids  - Diabetes  - Coronary Artery Disease  - Heart failure  - Heart attack  - Stroke  - DVT/VTE  - Cardiac arrhythmia  - Respiratory Failure/COPD  - Renal failure  - Anemia  - Advanced Liver disease  Therapy Plans: Outpatient therapy at [facility] ([place]) Disposition: Home with family Planned DVT Prophylaxis: Aspirin 81 mg BID DME Needed: None PCP: Irven Coe, MD (clearance received) TXA: IV Allergies: Adhesive tape, omeprazole, prednisone Metal Allergy: None Anesthesia Concerns: Difficulty awakening w/ general. No issues with prior TKA BMI: 34.7 Last HgbA1c: 6.0% (06/2022) - not diabetic. Pain Regimen: Oxycodone, tramadol Pharmacy: [facility] ([place])  - Patient was instructed on what medications to stop prior to surgery. - Follow-up visit in 2 weeks with Dr. Lequita Halt - Begin physical therapy following surgery - Pre-operative lab work as pre-surgical testing - Prescriptions will be provided in hospital at time of discharge  Arther Abbott, PA-C Orthopedic Surgery [facility]

## 2022-11-13 NOTE — Progress Notes (Signed)
COVID Vaccine Completed:  Date of COVID positive in last 90 days:  PCP - Irven Coe, MD Cardiologist - Belva Crome, MD (last OV 2019)  Chest x-ray - 06-02-22 Epic EKG -  Stress Test - 06-04-17 Epic ECHO - 06-09-17 Epic Cardiac Cath -  Pacemaker/ICD device last checked: Spinal Cord Stimulator:  Bowel Prep -   Sleep Study -  CPAP -   Fasting Blood Sugar -  Checks Blood Sugar _____ times a day  Last dose of GLP1 agonist-  N/A GLP1 instructions:  N/A   Last dose of SGLT-2 inhibitors-  N/A SGLT-2 instructions: N/A   Blood Thinner Instructions:  Time Aspirin Instructions: Last Dose:  Activity level:  Can go up a flight of stairs and perform activities of daily living without stopping and without symptoms of chest pain or shortness of breath.  Able to exercise without symptoms  Unable to go up a flight of stairs without symptoms of     Anesthesia review: LBBB  Patient denies shortness of breath, fever, cough and chest pain at PAT appointment  Patient verbalized understanding of instructions that were given to them at the PAT appointment. Patient was also instructed that they will need to review over the PAT instructions again at home before surgery.

## 2022-11-13 NOTE — Patient Instructions (Addendum)
SURGICAL WAITING ROOM VISITATION Patients having surgery or a procedure may have no more than 2 support people in the waiting area - these visitors may rotate.    Children under the age of 79 must have an adult with them who is not the patient.  If the patient needs to stay at the hospital during part of their recovery, the visitor guidelines for inpatient rooms apply. Pre-op nurse will coordinate an appropriate time for 1 support person to accompany patient in pre-op.  This support person may not rotate.    Please refer to the [facility] website for the visitor guidelines for Inpatients (after your surgery is over and you are in a regular room).       Your procedure is scheduled on: 11-23-22   Report to [facility] Main Entrance    Report to admitting at 10:10 AM   Call this number if you have problems the morning of surgery [phone]   Do not eat food :After Midnight.   After Midnight you may have the following liquids until 9:40 AM DAY OF SURGERY  Water Non-Citrus Juices (without pulp, NO RED-Apple, White grape, White cranberry) Black Coffee (NO MILK/CREAM OR CREAMERS, sugar ok)  Clear Tea (NO MILK/CREAM OR CREAMERS, sugar ok) regular and decaf                             Plain Jell-O (NO RED)                                           Fruit ices (not with fruit pulp, NO RED)                                     Popsicles (NO RED)                                                               Sports drinks like Gatorade (NO RED)                   The day of surgery:  Drink ONE (1) Pre-Surgery G2 by 9:40 AM the morning of surgery. Drink in one sitting. Do not sip.  This drink was given to you during your hospital  pre-op appointment visit. Nothing else to drink after completing the Pre-Surgery G2.          If you have questions, please contact your surgeon's office.   FOLLOW  ANY ADDITIONAL PRE OP INSTRUCTIONS YOU RECEIVED FROM YOUR SURGEON'S OFFICE!!!      Oral Hygiene is also important to reduce your risk of infection.                                    Remember - BRUSH YOUR TEETH THE MORNING OF SURGERY WITH YOUR REGULAR TOOTHPASTE   Do NOT smoke after Midnight   Take these medicines the morning of surgery with A SIP OF WATER:   Claritin  Pantoprazole  Okay to use  Albuterol inhaler and Flonase nasal spray  Stop all vitamins and herbal supplements 7 days before surgery                              You may not have any metal on your body including hair pins, jewelry, and body piercing             Do not wear make-up, lotions, powders, perfumes or deodorant  Do not wear nail polish including gel and S&S, artificial/acrylic nails, or any other type of covering on natural nails including finger and toenails. If you have artificial nails, gel coating, etc. that needs to be removed by a nail salon please have this removed prior to surgery or surgery may need to be canceled/ delayed if the surgeon/ anesthesia feels like they are unable to be safely monitored.   Do not shave  48 hours prior to surgery.            Do not bring valuables to the hospital. [name] IS NOT RESPONSIBLE   FOR VALUABLES.   Contacts, dentures or bridgework may not be worn into surgery.   Bring small overnight bag day of surgery.   DO NOT BRING YOUR HOME MEDICATIONS TO THE HOSPITAL. PHARMACY WILL DISPENSE MEDICATIONS LISTED ON YOUR MEDICATION LIST TO YOU DURING YOUR ADMISSION IN THE HOSPITAL!    Special Instructions: Bring a copy of your healthcare power of attorney and living will documents the day of surgery if you haven't scanned them before.              Please read over the following fact sheets you were given: IF YOU HAVE QUESTIONS ABOUT YOUR PRE-OP INSTRUCTIONS PLEASE CALL [phone] [name]  If you received a COVID test during your pre-op visit  it is requested that you wear a mask when out in public, stay away from anyone that may not be feeling well and  notify your surgeon if you develop symptoms. If you test positive for Covid or have been in contact with anyone that has tested positive in the last 10 days please notify you surgeon.    Pre-operative 5 CHG Bath Instructions   You can play a key role in reducing the risk of infection after surgery. Your skin needs to be as free of germs as possible. You can reduce the number of germs on your skin by washing with CHG (chlorhexidine gluconate) soap before surgery. CHG is an antiseptic soap that kills germs and continues to kill germs even after washing.   DO NOT use if you have an allergy to chlorhexidine/CHG or antibacterial soaps. If your skin becomes reddened or irritated, stop using the CHG and notify one of our RNs at [phone].   Please shower with the CHG soap starting 4 days before surgery using the following schedule:     Please keep in mind the following:  DO NOT shave, including legs and underarms, starting the day of your first shower.   You may shave your face at any point before/day of surgery.  Place clean sheets on your bed the day you start using CHG soap. Use a clean washcloth (not used since being washed) for each shower. DO NOT sleep with pets once you start using the CHG.   CHG Shower Instructions:  If you choose to wash your hair and private area, wash first with your normal shampoo/soap.  After you use shampoo/soap, rinse your hair and body  thoroughly to remove shampoo/soap residue.  Turn the water OFF and apply about 3 tablespoons (45 ml) of CHG soap to a CLEAN washcloth.  Apply CHG soap ONLY FROM YOUR NECK DOWN TO YOUR TOES (washing for 3-5 minutes)  DO NOT use CHG soap on face, private areas, open wounds, or sores.  Pay special attention to the area where your surgery is being performed.  If you are having back surgery, having someone wash your back for you may be helpful. Wait 2 minutes after CHG soap is applied, then you may rinse off the CHG soap.  Pat dry  with a clean towel  Put on clean clothes/pajamas   If you choose to wear lotion, please use ONLY the CHG-compatible lotions on the back of this paper.     Additional instructions for the day of surgery: DO NOT APPLY any lotions, deodorants, cologne, or perfumes.   Put on clean/comfortable clothes.  Brush your teeth.  Ask your nurse before applying any prescription medications to the skin.      CHG Compatible Lotions   Aveeno Moisturizing lotion  Cetaphil Moisturizing Cream  Cetaphil Moisturizing Lotion  Clairol Herbal Essence Moisturizing Lotion, Dry Skin  Clairol Herbal Essence Moisturizing Lotion, Extra Dry Skin  Clairol Herbal Essence Moisturizing Lotion, Normal Skin  Curel Age Defying Therapeutic Moisturizing Lotion with Alpha Hydroxy  Curel Extreme Care Body Lotion  Curel Soothing Hands Moisturizing Hand Lotion  Curel Therapeutic Moisturizing Cream, Fragrance-Free  Curel Therapeutic Moisturizing Lotion, Fragrance-Free  Curel Therapeutic Moisturizing Lotion, Original Formula  Eucerin Daily Replenishing Lotion  Eucerin Dry Skin Therapy Plus Alpha Hydroxy Crme  Eucerin Dry Skin Therapy Plus Alpha Hydroxy Lotion  Eucerin Original Crme  Eucerin Original Lotion  Eucerin Plus Crme Eucerin Plus Lotion  Eucerin TriLipid Replenishing Lotion  Keri Anti-Bacterial Hand Lotion  Keri Deep Conditioning Original Lotion Dry Skin Formula Softly Scented  Keri Deep Conditioning Original Lotion, Fragrance Free Sensitive Skin Formula  Keri Lotion Fast Absorbing Fragrance Free Sensitive Skin Formula  Keri Lotion Fast Absorbing Softly Scented Dry Skin Formula  Keri Original Lotion  Keri Skin Renewal Lotion Keri Silky Smooth Lotion  Keri Silky Smooth Sensitive Skin Lotion  Nivea Body Creamy Conditioning Oil  Nivea Body Extra Enriched Lotion  Nivea Body Original Lotion  Nivea Body Sheer Moisturizing Lotion Nivea Crme  Nivea Skin Firming Lotion  NutraDerm 30 Skin Lotion  NutraDerm Skin  Lotion  NutraDerm Therapeutic Skin Cream  NutraDerm Therapeutic Skin Lotion  ProShield Protective Hand Cream  Provon moisturizing lotion   PATIENT SIGNATURE_________________________________  NURSE SIGNATURE__________________________________  ________________________________________________________________________    Janet Diaz  An incentive spirometer is a tool that can help keep your lungs clear and active. This tool measures how well you are filling your lungs with each breath. Taking long deep breaths may help reverse or decrease the chance of developing breathing (pulmonary) problems (especially infection) following: A long period of time when you are unable to move or be active. BEFORE THE PROCEDURE  If the spirometer includes an indicator to show your best effort, your nurse or respiratory therapist will set it to a desired goal. If possible, sit up straight or lean slightly forward. Try not to slouch. Hold the incentive spirometer in an upright position. INSTRUCTIONS FOR USE  Sit on the edge of your bed if possible, or sit up as far as you can in bed or on a chair. Hold the incentive spirometer in an upright position. Breathe out normally. Place the mouthpiece in your mouth  and seal your lips tightly around it. Breathe in slowly and as deeply as possible, raising the piston or the ball toward the top of the column. Hold your breath for 3-5 seconds or for as long as possible. Allow the piston or ball to fall to the bottom of the column. Remove the mouthpiece from your mouth and breathe out normally. Rest for a few seconds and repeat Steps 1 through 7 at least 10 times every 1-2 hours when you are awake. Take your time and take a few normal breaths between deep breaths. The spirometer may include an indicator to show your best effort. Use the indicator as a goal to work toward during each repetition. After each set of 10 deep breaths, practice coughing to be sure your  lungs are clear. If you have an incision (the cut made at the time of surgery), support your incision when coughing by placing a pillow or rolled up towels firmly against it. Once you are able to get out of bed, walk around indoors and cough well. You may stop using the incentive spirometer when instructed by your caregiver.  RISKS AND COMPLICATIONS Take your time so you do not get dizzy or light-headed. If you are in pain, you may need to take or ask for pain medication before doing incentive spirometry. It is harder to take a deep breath if you are having pain. AFTER USE Rest and breathe slowly and easily. It can be helpful to keep track of a log of your progress. Your caregiver can provide you with a simple table to help with this. If you are using the spirometer at home, follow these instructions: SEEK MEDICAL CARE IF:  You are having difficultly using the spirometer. You have trouble using the spirometer as often as instructed. Your pain medication is not giving enough relief while using the spirometer. You develop fever of 100.5 F (38.1 C) or higher. SEEK IMMEDIATE MEDICAL CARE IF:  You cough up bloody sputum that had not been present before. You develop fever of 102 F (38.9 C) or greater. You develop worsening pain at or near the incision site. MAKE SURE YOU:  Understand these instructions. Will watch your condition. Will get help right away if you are not doing well or get worse. Document Released: 05/11/2006 Document Revised: 03/23/2011 Document Reviewed: 07/12/2006 [facility] Patient Information 2014 [place], [place].   ________________________________________________________________________

## 2022-11-16 ENCOUNTER — Other Ambulatory Visit: Payer: Self-pay

## 2022-11-16 ENCOUNTER — Encounter (HOSPITAL_COMMUNITY)
Admission: RE | Admit: 2022-11-16 | Discharge: 2022-11-16 | Disposition: A | Discharge status: Home / Self Care | Payer: Medicare Other | Source: Ambulatory Visit | Attending: Orthopedic Surgery | Admitting: Orthopedic Surgery

## 2022-11-16 ENCOUNTER — Encounter (HOSPITAL_COMMUNITY): Payer: Self-pay

## 2022-11-16 VITALS — BP 139/83 | HR 62 | Temp 98.6°F | Resp 16 | Ht 60.0 in | Wt 207.6 lb

## 2022-11-16 DIAGNOSIS — I2510 Atherosclerotic heart disease of native coronary artery without angina pectoris: Secondary | ICD-10-CM | POA: Diagnosis not present

## 2022-11-16 DIAGNOSIS — Z01818 Encounter for other preprocedural examination: Secondary | ICD-10-CM | POA: Diagnosis present

## 2022-11-16 DIAGNOSIS — I447 Left bundle-branch block, unspecified: Secondary | ICD-10-CM | POA: Diagnosis not present

## 2022-11-16 HISTORY — DX: Nausea with vomiting, unspecified: R11.2

## 2022-11-16 HISTORY — DX: Left bundle-branch block, unspecified: I44.7

## 2022-11-16 HISTORY — DX: Other specified postprocedural states: Z98.890

## 2022-11-16 HISTORY — DX: Prediabetes: R73.03

## 2022-11-16 HISTORY — DX: Dyspnea, unspecified: R06.00

## 2022-11-16 HISTORY — DX: Basal cell carcinoma of skin, unspecified: C44.91

## 2022-11-16 HISTORY — DX: Other specified postprocedural states: R11.2

## 2022-11-16 LAB — BASIC METABOLIC PANEL
Anion gap: 10 (ref 5–15)
BUN: 17 mg/dL (ref 8–23)
CO2: 25 mmol/L (ref 22–32)
Calcium: 8.8 mg/dL — ABNORMAL LOW (ref 8.9–10.3)
Chloride: 104 mmol/L (ref 98–111)
Creatinine, Ser: 0.74 mg/dL (ref 0.44–1.00)
GFR, Estimated: >60 mL/min (ref >60)
Glucose, Bld: 101 mg/dL — ABNORMAL HIGH (ref 70–99)
Potassium: 4.1 mmol/L (ref 3.5–5.1)
Sodium: 139 mmol/L (ref 135–145)

## 2022-11-16 LAB — CBC
HCT: 42.8 % (ref 36.0–46.0)
Hemoglobin: 13.8 g/dL (ref 12.0–15.0)
MCH: 31.1 pg (ref 26.0–34.0)
MCHC: 32.2 g/dL (ref 30.0–36.0)
MCV: 96.4 fL (ref 80.0–100.0)
Platelets: 206 10*3/uL (ref 150–400)
RBC: 4.44 MIL/uL (ref 3.87–5.11)
RDW: 13 % (ref 11.5–15.5)
WBC: 5.3 10*3/uL (ref 4.0–10.5)
nRBC: 0 % (ref 0.0–0.2)

## 2022-11-16 LAB — SURGICAL PCR SCREEN
MRSA, PCR: NEGATIVE
Staphylococcus aureus: NEGATIVE

## 2022-11-21 NOTE — Anesthesia Preprocedure Evaluation (Signed)
Anesthesia Evaluation  Patient identified by MRN, date of birth, ID band Patient awake    Reviewed: Allergy & Precautions, NPO status , Patient's Chart, lab work & pertinent test results  History of Anesthesia Complications (+) PONV and history of anesthetic complications  Airway Mallampati: III  TM Distance: >3 FB     Dental no notable dental hx. (+) Dental Advisory Given   Pulmonary shortness of breath   Pulmonary exam normal breath sounds clear to auscultation       Cardiovascular negative cardio ROS Normal cardiovascular exam+ dysrhythmias  Rhythm:Regular Rate:Normal  EKG 11/16/22 NSR, LBBB pattern  Echo 5/19 LVEF 50-55%, diasolic dysfunction-mild, mild TR   Neuro/Psych  PSYCHIATRIC DISORDERS Anxiety Depression     Neuromuscular disease    GI/Hepatic Neg liver ROS,GERD  Medicated,,  Endo/Other    Morbid obesity  Renal/GU negative Renal ROS  negative genitourinary   Musculoskeletal  (+) Arthritis , Osteoarthritis,  OA left knee   Abdominal  (+) + obese  Peds  Hematology negative hematology ROS (+)   Anesthesia Other Findings   Reproductive/Obstetrics                              Anesthesia Physical Anesthesia Plan  ASA: 3  Anesthesia Plan: Spinal   Post-op Pain Management: Regional block* and Minimal or no pain anticipated   Induction: Intravenous  PONV Risk Score and Plan: 4 or greater and Treatment may vary due to age or medical condition and Propofol infusion  Airway Management Planned: Natural Airway and Simple Face Mask  Additional Equipment: None  Intra-op Plan:   Post-operative Plan:   Informed Consent: I have reviewed the patients History and Physical, chart, labs and discussed the procedure including the risks, benefits and alternatives for the proposed anesthesia with the patient or authorized representative who has indicated his/her understanding and  acceptance.     Dental advisory given  Plan Discussed with: Anesthesiologist and CRNA  Anesthesia Plan Comments:          Anesthesia Quick Evaluation

## 2022-11-23 ENCOUNTER — Encounter (HOSPITAL_COMMUNITY): Payer: Self-pay | Admitting: Orthopedic Surgery

## 2022-11-23 ENCOUNTER — Other Ambulatory Visit: Payer: Self-pay

## 2022-11-23 ENCOUNTER — Ambulatory Visit (HOSPITAL_COMMUNITY): Payer: Medicare Other | Admitting: Physician Assistant

## 2022-11-23 ENCOUNTER — Observation Stay (HOSPITAL_COMMUNITY)
Admission: RE | Admit: 2022-11-23 | Discharge: 2022-11-28 | Disposition: A | Discharge status: Home / Self Care | Payer: Medicare Other | Source: Ambulatory Visit | Attending: Orthopedic Surgery | Admitting: Orthopedic Surgery

## 2022-11-23 ENCOUNTER — Ambulatory Visit (HOSPITAL_COMMUNITY): Payer: Medicare Other | Admitting: Anesthesiology

## 2022-11-23 ENCOUNTER — Encounter (HOSPITAL_COMMUNITY)
Admission: RE | Disposition: A | Discharge status: Home / Self Care | Payer: Self-pay | Source: Ambulatory Visit | Attending: Orthopedic Surgery

## 2022-11-23 DIAGNOSIS — Z7982 Long term (current) use of aspirin: Secondary | ICD-10-CM | POA: Diagnosis not present

## 2022-11-23 DIAGNOSIS — Z96651 Presence of right artificial knee joint: Secondary | ICD-10-CM | POA: Insufficient documentation

## 2022-11-23 DIAGNOSIS — M1712 Unilateral primary osteoarthritis, left knee: Secondary | ICD-10-CM | POA: Diagnosis present

## 2022-11-23 DIAGNOSIS — Z85828 Personal history of other malignant neoplasm of skin: Secondary | ICD-10-CM | POA: Diagnosis not present

## 2022-11-23 DIAGNOSIS — M179 Osteoarthritis of knee, unspecified: Principal | ICD-10-CM | POA: Diagnosis present

## 2022-11-23 DIAGNOSIS — Z79899 Other long term (current) drug therapy: Secondary | ICD-10-CM | POA: Diagnosis not present

## 2022-11-23 HISTORY — PX: TOTAL KNEE ARTHROPLASTY: SHX125

## 2022-11-23 SURGERY — ARTHROPLASTY, KNEE, TOTAL
Anesthesia: Spinal | Site: Knee | Laterality: Left

## 2022-11-23 MED ORDER — ONDANSETRON HCL 4 MG/2ML IJ SOLN
INTRAMUSCULAR | Status: DC | PRN
  Administered 2022-11-23: 4 mg via INTRAVENOUS

## 2022-11-23 MED ORDER — TRANEXAMIC ACID-NACL 1000-0.7 MG/100ML-% IV SOLN
1000.0000 mg | INTRAVENOUS | Status: AC
  Administered 2022-11-23: 1000 mg via INTRAVENOUS
  Filled 2022-11-23: qty 100

## 2022-11-23 MED ORDER — BISACODYL 10 MG RE SUPP: 10.0000 mg | Freq: Every day | RECTAL | Status: DC | PRN

## 2022-11-23 MED ORDER — LACTATED RINGERS IV SOLN: INTRAVENOUS | Status: DC

## 2022-11-23 MED ORDER — METHOCARBAMOL 1000 MG/10ML IJ SOLN: 500.0000 mg | Freq: Four times a day (QID) | INTRAMUSCULAR | Status: DC | PRN

## 2022-11-23 MED ORDER — PHENYLEPHRINE HCL (PRESSORS) 10 MG/ML IV SOLN
INTRAVENOUS | Status: DC | PRN
  Administered 2022-11-23: 80 ug via INTRAVENOUS

## 2022-11-23 MED ORDER — ORAL CARE MOUTH RINSE: 15.0000 mL | Freq: Once | OROMUCOSAL | Status: AC

## 2022-11-23 MED ORDER — PANTOPRAZOLE SODIUM 40 MG PO TBEC
40.0000 mg | DELAYED_RELEASE_TABLET | Freq: Every day | ORAL | Status: DC
  Administered 2022-11-24 – 2022-11-28 (×5): 40 mg via ORAL
  Filled 2022-11-23 (×5): qty 1

## 2022-11-23 MED ORDER — HYDROMORPHONE HCL 1 MG/ML IJ SOLN
0.2500 mg | INTRAMUSCULAR | Status: DC | PRN
Start: 2022-11-23 — End: 2022-11-23

## 2022-11-23 MED ORDER — FLUTICASONE PROPIONATE 50 MCG/ACT NA SUSP: 1.0000 | Freq: Two times a day (BID) | NASAL | Status: DC | PRN

## 2022-11-23 MED ORDER — METHOCARBAMOL 500 MG PO TABS
500.0000 mg | ORAL_TABLET | Freq: Four times a day (QID) | ORAL | Status: DC | PRN
  Administered 2022-11-23 – 2022-11-28 (×7): 500 mg via ORAL
  Filled 2022-11-23 (×7): qty 1

## 2022-11-23 MED ORDER — SODIUM CHLORIDE (PF) 0.9 % IJ SOLN
INTRAMUSCULAR | Status: DC | PRN
  Administered 2022-11-23: 60 mL

## 2022-11-23 MED ORDER — POVIDONE-IODINE 10 % EX SWAB: 2.0000 | Freq: Once | CUTANEOUS | Status: DC

## 2022-11-23 MED ORDER — CEFAZOLIN SODIUM-DEXTROSE 2-4 GM/100ML-% IV SOLN
2.0000 g | INTRAVENOUS | Status: AC
  Administered 2022-11-23: 2 g via INTRAVENOUS
  Filled 2022-11-23: qty 100

## 2022-11-23 MED ORDER — PHENYLEPHRINE HCL-NACL 20-0.9 MG/250ML-% IV SOLN
INTRAVENOUS | Status: DC | PRN
  Administered 2022-11-23: 40 ug/min via INTRAVENOUS

## 2022-11-23 MED ORDER — SODIUM CHLORIDE (PF) 0.9 % IJ SOLN
INTRAMUSCULAR | Status: AC
  Filled 2022-11-23: qty 50

## 2022-11-23 MED ORDER — NYSTATIN 100000 UNIT/GM EX POWD: 1.0000 | Freq: Three times a day (TID) | CUTANEOUS | Status: DC | PRN

## 2022-11-23 MED ORDER — ONDANSETRON HCL 4 MG/2ML IJ SOLN
INTRAMUSCULAR | Status: AC
  Filled 2022-11-23: qty 2

## 2022-11-23 MED ORDER — OXYCODONE HCL 5 MG/5ML PO SOLN: 5.0000 mg | Freq: Once | ORAL | Status: DC | PRN

## 2022-11-23 MED ORDER — DOCUSATE SODIUM 100 MG PO CAPS
100.0000 mg | ORAL_CAPSULE | Freq: Two times a day (BID) | ORAL | Status: DC
  Administered 2022-11-23 – 2022-11-28 (×10): 100 mg via ORAL
  Filled 2022-11-23 (×10): qty 1

## 2022-11-23 MED ORDER — PROPOFOL 500 MG/50ML IV EMUL
INTRAVENOUS | Status: DC | PRN
  Administered 2022-11-23: 40 ug/kg/min via INTRAVENOUS

## 2022-11-23 MED ORDER — ROPIVACAINE HCL 5 MG/ML IJ SOLN
INTRAMUSCULAR | Status: DC | PRN
  Administered 2022-11-23: 30 mL via PERINEURAL

## 2022-11-23 MED ORDER — ONDANSETRON HCL 4 MG/2ML IJ SOLN: 4.0000 mg | Freq: Four times a day (QID) | INTRAMUSCULAR | Status: DC | PRN

## 2022-11-23 MED ORDER — OXYCODONE HCL 5 MG PO TABS: 5.0000 mg | ORAL_TABLET | Freq: Once | ORAL | Status: DC | PRN

## 2022-11-23 MED ORDER — POTASSIUM CHLORIDE CRYS ER 10 MEQ PO TBCR: 10.0000 meq | EXTENDED_RELEASE_TABLET | Freq: Once | ORAL | Status: DC | PRN

## 2022-11-23 MED ORDER — BUPIVACAINE LIPOSOME 1.3 % IJ SUSP
INTRAMUSCULAR | Status: AC
  Filled 2022-11-23: qty 20

## 2022-11-23 MED ORDER — BUPIVACAINE IN DEXTROSE 0.75-8.25 % IT SOLN
INTRATHECAL | Status: DC | PRN
  Administered 2022-11-23: 1.6 mL via INTRATHECAL

## 2022-11-23 MED ORDER — ALBUTEROL SULFATE HFA 108 (90 BASE) MCG/ACT IN AERS: 2.0000 | INHALATION_SPRAY | Freq: Four times a day (QID) | RESPIRATORY_TRACT | Status: DC | PRN

## 2022-11-23 MED ORDER — CHLORHEXIDINE GLUCONATE 0.12 % MT SOLN
15.0000 mL | Freq: Once | OROMUCOSAL | Status: AC
  Administered 2022-11-23: 15 mL via OROMUCOSAL

## 2022-11-23 MED ORDER — METOCLOPRAMIDE HCL 5 MG/ML IJ SOLN: 5.0000 mg | Freq: Three times a day (TID) | INTRAMUSCULAR | Status: DC | PRN

## 2022-11-23 MED ORDER — BUPIVACAINE LIPOSOME 1.3 % IJ SUSP
INTRAMUSCULAR | Status: DC | PRN
  Administered 2022-11-23: 20 mL

## 2022-11-23 MED ORDER — FENTANYL CITRATE PF 50 MCG/ML IJ SOSY
100.0000 ug | PREFILLED_SYRINGE | INTRAMUSCULAR | Status: DC
  Administered 2022-11-23: 50 ug via INTRAVENOUS
  Filled 2022-11-23: qty 2

## 2022-11-23 MED ORDER — ACETAMINOPHEN 500 MG PO TABS
1000.0000 mg | ORAL_TABLET | Freq: Four times a day (QID) | ORAL | Status: AC
  Administered 2022-11-23 – 2022-11-24 (×4): 1000 mg via ORAL
  Filled 2022-11-23 (×4): qty 2

## 2022-11-23 MED ORDER — CEFAZOLIN SODIUM-DEXTROSE 2-4 GM/100ML-% IV SOLN
2.0000 g | Freq: Four times a day (QID) | INTRAVENOUS | Status: AC
  Administered 2022-11-23 (×2): 2 g via INTRAVENOUS
  Filled 2022-11-23 (×2): qty 100

## 2022-11-23 MED ORDER — ASPIRIN 81 MG PO CHEW
81.0000 mg | CHEWABLE_TABLET | Freq: Two times a day (BID) | ORAL | Status: DC
  Administered 2022-11-24 – 2022-11-28 (×9): 81 mg via ORAL
  Filled 2022-11-23 (×9): qty 1

## 2022-11-23 MED ORDER — FUROSEMIDE 20 MG PO TABS: 20.0000 mg | ORAL_TABLET | Freq: Every day | ORAL | Status: DC | PRN

## 2022-11-23 MED ORDER — MENTHOL 3 MG MT LOZG
1.0000 | LOZENGE | OROMUCOSAL | Status: DC | PRN
Start: 2022-11-23 — End: 2022-11-28

## 2022-11-23 MED ORDER — DIPHENHYDRAMINE HCL 12.5 MG/5ML PO ELIX
12.5000 mg | ORAL_SOLUTION | ORAL | Status: DC | PRN
  Administered 2022-11-24 (×2): 12.5 mg via ORAL
  Filled 2022-11-23 (×2): qty 5

## 2022-11-23 MED ORDER — PHENOL 1.4 % MT LIQD: 1.0000 | OROMUCOSAL | Status: DC | PRN

## 2022-11-23 MED ORDER — PROPOFOL 1000 MG/100ML IV EMUL
INTRAVENOUS | Status: AC
  Filled 2022-11-23: qty 400

## 2022-11-23 MED ORDER — ALBUTEROL SULFATE (2.5 MG/3ML) 0.083% IN NEBU: 2.5000 mg | INHALATION_SOLUTION | Freq: Four times a day (QID) | RESPIRATORY_TRACT | Status: DC | PRN

## 2022-11-23 MED ORDER — ACETAMINOPHEN 10 MG/ML IV SOLN
1000.0000 mg | Freq: Once | INTRAVENOUS | Status: AC
  Administered 2022-11-23: 1000 mg via INTRAVENOUS
  Filled 2022-11-23: qty 100

## 2022-11-23 MED ORDER — MIDAZOLAM HCL 2 MG/2ML IJ SOLN: 2.0000 mg | INTRAMUSCULAR | Status: DC

## 2022-11-23 MED ORDER — BUPIVACAINE LIPOSOME 1.3 % IJ SUSP: 20.0000 mL | Freq: Once | INTRAMUSCULAR | Status: DC

## 2022-11-23 MED ORDER — MONTELUKAST SODIUM 10 MG PO TABS
10.0000 mg | ORAL_TABLET | Freq: Every day | ORAL | Status: DC
  Administered 2022-11-23 – 2022-11-27 (×5): 10 mg via ORAL
  Filled 2022-11-23 (×5): qty 1

## 2022-11-23 MED ORDER — NYSTATIN 100000 UNIT/GM EX POWD
Freq: Two times a day (BID) | CUTANEOUS | Status: DC
  Filled 2022-11-23: qty 15

## 2022-11-23 MED ORDER — SODIUM CHLORIDE 0.9 % IV SOLN: INTRAVENOUS | Status: DC

## 2022-11-23 MED ORDER — 0.9 % SODIUM CHLORIDE (POUR BTL) OPTIME
TOPICAL | Status: DC | PRN
  Administered 2022-11-23: 1000 mL

## 2022-11-23 MED ORDER — DEXAMETHASONE SODIUM PHOSPHATE 10 MG/ML IJ SOLN: 8.0000 mg | Freq: Once | INTRAMUSCULAR | Status: DC

## 2022-11-23 MED ORDER — OXYCODONE HCL 5 MG PO TABS
5.0000 mg | ORAL_TABLET | ORAL | Status: DC | PRN
  Administered 2022-11-23 – 2022-11-28 (×17): 10 mg via ORAL
  Filled 2022-11-23 (×19): qty 2

## 2022-11-23 MED ORDER — LORATADINE 10 MG PO TABS
10.0000 mg | ORAL_TABLET | Freq: Every day | ORAL | Status: DC
  Administered 2022-11-24 – 2022-11-28 (×5): 10 mg via ORAL
  Filled 2022-11-23 (×6): qty 1

## 2022-11-23 MED ORDER — PROPOFOL 500 MG/50ML IV EMUL: INTRAVENOUS | Status: DC | PRN

## 2022-11-23 MED ORDER — ONDANSETRON HCL 4 MG PO TABS: 4.0000 mg | ORAL_TABLET | Freq: Four times a day (QID) | ORAL | Status: DC | PRN

## 2022-11-23 MED ORDER — METOCLOPRAMIDE HCL 5 MG PO TABS: 5.0000 mg | ORAL_TABLET | Freq: Three times a day (TID) | ORAL | Status: DC | PRN

## 2022-11-23 MED ORDER — CITALOPRAM HYDROBROMIDE 20 MG PO TABS
20.0000 mg | ORAL_TABLET | Freq: Every day | ORAL | Status: DC
  Administered 2022-11-23 – 2022-11-27 (×5): 20 mg via ORAL
  Filled 2022-11-23 (×5): qty 1

## 2022-11-23 MED ORDER — MORPHINE SULFATE (PF) 2 MG/ML IV SOLN
1.0000 mg | INTRAVENOUS | Status: DC | PRN
  Administered 2022-11-24: 1 mg via INTRAVENOUS
  Filled 2022-11-23: qty 1

## 2022-11-23 MED ORDER — ONDANSETRON HCL 4 MG/2ML IJ SOLN: 4.0000 mg | Freq: Once | INTRAMUSCULAR | Status: DC | PRN

## 2022-11-23 MED ORDER — FLEET ENEMA RE ENEM: 1.0000 | ENEMA | Freq: Once | RECTAL | Status: DC | PRN

## 2022-11-23 MED ORDER — POLYETHYLENE GLYCOL 3350 17 G PO PACK: 17.0000 g | PACK | Freq: Every day | ORAL | Status: DC | PRN

## 2022-11-23 MED ORDER — OXYCODONE HCL 5 MG PO TABS
10.0000 mg | ORAL_TABLET | ORAL | Status: DC | PRN
  Administered 2022-11-24: 10 mg via ORAL
  Administered 2022-11-24 – 2022-11-25 (×2): 15 mg via ORAL
  Administered 2022-11-26 – 2022-11-27 (×2): 10 mg via ORAL
  Filled 2022-11-23: qty 2
  Filled 2022-11-23 (×2): qty 3

## 2022-11-23 MED ORDER — SODIUM CHLORIDE 0.9 % IR SOLN
Status: DC | PRN
  Administered 2022-11-23: 1000 mL

## 2022-11-23 MED ORDER — SODIUM CHLORIDE (PF) 0.9 % IJ SOLN
INTRAMUSCULAR | Status: AC
  Filled 2022-11-23: qty 10

## 2022-11-23 MED ORDER — STERILE WATER FOR IRRIGATION IR SOLN
Status: DC | PRN
  Administered 2022-11-23: 1000 mL

## 2022-11-23 SURGICAL SUPPLY — 54 items
ADH SKN CLS APL DERMABOND .7 (GAUZE/BANDAGES/DRESSINGS) ×1
ADH SKN CLS LQ APL DERMABOND (GAUZE/BANDAGES/DRESSINGS) ×1
ATTUNE PS FEM LT SZ 4 CEM KNEE (Femur) IMPLANT
ATTUNE PSRP INSR SZ4 10 KNEE (Insert) IMPLANT
BAG COUNTER SPONGE SURGICOUNT (BAG) IMPLANT
BAG SPEC THK2 15X12 ZIP CLS (MISCELLANEOUS) ×1
BAG SPNG CNTER NS LX DISP (BAG)
BAG ZIPLOCK 12X15 (MISCELLANEOUS) ×2 IMPLANT
BASEPLATE TIBIAL ROTATING SZ 4 (Knees) IMPLANT
BLADE SAG 18X100X1.27 (BLADE) ×2 IMPLANT
BLADE SAW SGTL 11.0X1.19X90.0M (BLADE) ×2 IMPLANT
BNDG CMPR 6 X 5 YARDS HK CLSR (GAUZE/BANDAGES/DRESSINGS) ×1
BNDG ELASTIC 6INX 5YD STR LF (GAUZE/BANDAGES/DRESSINGS) ×2 IMPLANT
BOWL SMART MIX CTS (DISPOSABLE) ×2 IMPLANT
BSPLAT TIB 4 CMNT ROT PLAT STR (Knees) ×1 IMPLANT
CEMENT HV SMART SET (Cement) ×4 IMPLANT
COVER SURGICAL LIGHT HANDLE (MISCELLANEOUS) ×2 IMPLANT
CUFF TOURN SGL QUICK 34 (TOURNIQUET CUFF) ×1
CUFF TRNQT CYL 34X4.125X (TOURNIQUET CUFF) ×2 IMPLANT
DERMABOND ADVANCED .7 DNX12 (GAUZE/BANDAGES/DRESSINGS) ×2 IMPLANT
DERMABOND ADVANCED .7 DNX6 (GAUZE/BANDAGES/DRESSINGS) IMPLANT
DRAPE U-SHAPE 47X51 STRL (DRAPES) ×2 IMPLANT
DRSG AQUACEL AG ADV 3.5X10 (GAUZE/BANDAGES/DRESSINGS) ×2 IMPLANT
DURAPREP 26ML APPLICATOR (WOUND CARE) ×2 IMPLANT
ELECT REM PT RETURN 15FT ADLT (MISCELLANEOUS) ×2 IMPLANT
GLOVE BIO SURGEON STRL SZ 6.5 (GLOVE) IMPLANT
GLOVE BIO SURGEON STRL SZ8 (GLOVE) ×2 IMPLANT
GLOVE BIOGEL PI IND STRL 6.5 (GLOVE) IMPLANT
GLOVE BIOGEL PI IND STRL 7.0 (GLOVE) IMPLANT
GLOVE BIOGEL PI IND STRL 8 (GLOVE) ×2 IMPLANT
GOWN STRL REUS W/ TWL LRG LVL3 (GOWN DISPOSABLE) ×2 IMPLANT
GOWN STRL REUS W/TWL LRG LVL3 (GOWN DISPOSABLE) ×1
HANDPIECE INTERPULSE COAX TIP (DISPOSABLE) ×1
HOLDER FOLEY CATH W/STRAP (MISCELLANEOUS) IMPLANT
IMMOBILIZER KNEE 20 (SOFTGOODS) ×1
IMMOBILIZER KNEE 20 THIGH 36 (SOFTGOODS) ×2 IMPLANT
KIT TURNOVER KIT A (KITS) IMPLANT
MANIFOLD NEPTUNE II (INSTRUMENTS) ×2 IMPLANT
NS IRRIG 1000ML POUR BTL (IV SOLUTION) ×2 IMPLANT
PACK TOTAL KNEE CUSTOM (KITS) ×2 IMPLANT
PADDING CAST COTTON 6X4 STRL (CAST SUPPLIES) ×4 IMPLANT
PATELLA MEDIAL ATTUN 35MM KNEE (Knees) IMPLANT
PIN STEINMAN FIXATION KNEE (PIN) IMPLANT
PROTECTOR NERVE ULNAR (MISCELLANEOUS) ×2 IMPLANT
SET HNDPC FAN SPRY TIP SCT (DISPOSABLE) ×2 IMPLANT
SUT MNCRL AB 4-0 PS2 18 (SUTURE) ×2 IMPLANT
SUT STRATAFIX 0 PDS 27 VIOLET (SUTURE) ×1
SUT VIC AB 2-0 CT1 27 (SUTURE) ×3
SUT VIC AB 2-0 CT1 TAPERPNT 27 (SUTURE) ×6 IMPLANT
SUTURE STRATFX 0 PDS 27 VIOLET (SUTURE) ×2 IMPLANT
TRAY FOLEY MTR SLVR 16FR STAT (SET/KITS/TRAYS/PACK) IMPLANT
TUBE SUCTION HIGH CAP CLEAR NV (SUCTIONS) ×2 IMPLANT
WATER STERILE IRR 1000ML POUR (IV SOLUTION) ×4 IMPLANT
WRAP KNEE MAXI GEL POST OP (GAUZE/BANDAGES/DRESSINGS) ×2 IMPLANT

## 2022-11-23 NOTE — Interval H&P Note (Signed)
History and Physical Interval Note:  11/23/2022 10:39 AM  Janet Diaz  has presented today for surgery, with the diagnosis of left knee osteoarthritis.  The various methods of treatment have been discussed with the patient and family. After consideration of risks, benefits and other options for treatment, the patient has consented to  Procedure(s): TOTAL KNEE ARTHROPLASTY (Left) as a surgical intervention.  The patient's history has been reviewed, patient examined, no change in status, stable for surgery.  I have reviewed the patient's chart and labs.  Questions were answered to the patient's satisfaction.     Homero Fellers Malikhi Ogan

## 2022-11-23 NOTE — Transfer of Care (Signed)
Immediate Anesthesia Transfer of Care Note  Patient: Janet Diaz  Procedure(s) Performed: TOTAL KNEE ARTHROPLASTY (Left: Knee)  Patient Location: PACU  Anesthesia Type:MAC, Regional, and Spinal  Level of Consciousness: awake, alert , and oriented  Airway & Oxygen Therapy: Patient Spontanous Breathing and Patient connected to nasal cannula oxygen  Post-op Assessment: Report given to RN and Post -op Vital signs reviewed and stable  Post vital signs: Reviewed and stable  Last Vitals:  Vitals Value Taken Time  BP 132/61 11/23/22 1321  Temp    Pulse 50 11/23/22 1323  Resp 9 11/23/22 1323  SpO2 100 % 11/23/22 1323  Vitals shown include unfiled device data.  Last Pain:  Vitals:   11/23/22 1110  TempSrc: Oral  PainSc: 0-No pain         Complications: No notable events documented.

## 2022-11-23 NOTE — Op Note (Signed)
OPERATIVE REPORT-TOTAL KNEE ARTHROPLASTY   Pre-operative diagnosis- Osteoarthritis  Left knee(s)  Post-operative diagnosis- Osteoarthritis Left knee(s)  Procedure-  Left  Total Knee Arthroplasty  Surgeon- Gus Rankin. Corinda Ammon, MD  Assistant- Weston Brass, PA-C   Anesthesia-   Adductor canal block and spinal  EBL-25 ml   Drains None  Tourniquet time-  Total Tourniquet Time Documented: Thigh (Left) - 35 minutes Total: Thigh (Left) - 35 minutes     Complications- None  Condition-PACU - hemodynamically stable.   Brief Clinical Note  Janet Diaz is a 77 y.o. year old female with end stage OA of her left knee with progressively worsening pain and dysfunction. She has constant pain, with activity and at rest and significant functional deficits with difficulties even with ADLs. She has had extensive non-op management including analgesics, injections of cortisone and viscosupplements, and home exercise program, but remains in significant pain with significant dysfunction. Radiographs show bone on bone arthritis medial and patellofemoral. She presents now for left Total Knee Arthroplasty.     Procedure in detail---   The patient is brought into the operating room and positioned supine on the operating table. After successful administration of   adductor canal block and spinal ,   a tourniquet is placed high on the  Left thigh(s) and the lower extremity is prepped and draped in the usual sterile fashion. Time out is performed by the operating team and then the  Left lower extremity is wrapped in Esmarch, knee flexed and the tourniquet inflated to 300 mmHg.       A midline incision is made with a ten blade through the subcutaneous tissue to the level of the extensor mechanism. A fresh blade is used to make a medial parapatellar arthrotomy. Soft tissue over the proximal medial tibia is subperiosteally elevated to the joint line with a knife and into the semimembranosus bursa with a Cobb  elevator. Soft tissue over the proximal lateral tibia is elevated with attention being paid to avoiding the patellar tendon on the tibial tubercle. The patella is everted, knee flexed 90 degrees and the ACL and PCL are removed. Findings are bone on bone medial and patellofemoral with large global osteophytes        The drill is used to create a starting hole in the distal femur and the canal is thoroughly irrigated with sterile saline to remove the fatty contents. The 5 degree Left  valgus alignment guide is placed into the femoral canal and the distal femoral cutting block is pinned to remove 9 mm off the distal femur. Resection is made with an oscillating saw.      The tibia is subluxed forward and the menisci are removed. The extramedullary alignment guide is placed referencing proximally at the medial aspect of the tibial tubercle and distally along the second metatarsal axis and tibial crest. The block is pinned to remove 2mm off the more deficient medial  side. Resection is made with an oscillating saw. Size 4is the most appropriate size for the tibia and the proximal tibia is prepared with the modular drill and keel punch for that size.      The femoral sizing guide is placed and size 4 is most appropriate. Rotation is marked off the epicondylar axis and confirmed by creating a rectangular flexion gap at 90 degrees. The size 4 cutting block is pinned in this rotation and the anterior, posterior and chamfer cuts are made with the oscillating saw. The intercondylar block is then placed and that  cut is made.      Trial size 3 tibial component, trial size 4 posterior stabilized femur and a 10  mm posterior stabilized rotating platform insert trial is placed. Full extension is achieved with excellent varus/valgus and anterior/posterior balance throughout full range of motion. The patella is everted and thickness measured to be 22  mm. Free hand resection is taken to 12 mm, a 35 template is placed, lug holes  are drilled, trial patella is placed, and it tracks normally. Osteophytes are removed off the posterior femur with the trial in place. All trials are removed and the cut bone surfaces prepared with pulsatile lavage. Cement is mixed and once ready for implantation, the size 4 tibial implant, size  4 posterior stabilized femoral component, and the size 35 patella are cemented in place and the patella is held with the clamp. The trial insert is placed and the knee held in full extension. The Exparel (20 ml mixed with 60 ml saline) is injected into the extensor mechanism, posterior capsule, medial and lateral gutters and subcutaneous tissues.  All extruded cement is removed and once the cement is hard the permanent 10 mm posterior stabilized rotating platform insert is placed into the tibial tray.      The wound is copiously irrigated with saline solution and the extensor mechanism closed with # 0 Stratofix suture. The tourniquet is released for a total tourniquet time of 35  minutes. Flexion against gravity is 140 degrees and the patella tracks normally. Subcutaneous tissue is closed with 2.0 vicryl and subcuticular with running 4.0 Monocryl. The incision is cleaned and dried and steri-strips and a bulky sterile dressing are applied. The limb is placed into a knee immobilizer and the patient is awakened and transported to recovery in stable condition.      Please note that a surgical assistant was a medical necessity for this procedure in order to perform it in a safe and expeditious manner. Surgical assistant was necessary to retract the ligaments and vital neurovascular structures to prevent injury to them and also necessary for proper positioning of the limb to allow for anatomic placement of the prosthesis.   Gus Rankin Janet Luzader, MD    11/23/2022, 12:48 PM

## 2022-11-23 NOTE — Anesthesia Procedure Notes (Signed)
Spinal  Patient location during procedure: OR Start time: 11/23/2022 11:35 AM End time: 11/23/2022 11:37 AM Reason for block: surgical anesthesia Staffing Performed by: Mal Amabile, MD Authorized by: Mal Amabile, MD   Preanesthetic Checklist Completed: patient identified, IV checked, site marked, risks and benefits discussed, surgical consent, monitors and equipment checked, pre-op evaluation and timeout performed Spinal Block Patient position: sitting Prep: DuraPrep and site prepped and draped Patient monitoring: heart rate, cardiac monitor, continuous pulse ox and blood pressure Approach: midline Location: L3-4 Injection technique: single-shot Needle Needle type: Pencan  Needle gauge: 24 G Needle length: 9 cm Needle insertion depth: 8 cm Assessment Sensory level: T4 Events: CSF return Additional Notes Patient tolerated procedure well. Adequate sensory level.

## 2022-11-23 NOTE — Anesthesia Procedure Notes (Signed)
Procedure Name: MAC Date/Time: 11/23/2022 11:37 AM  Performed by: Nathen May, CRNAPre-anesthesia Checklist: Patient identified, Emergency Drugs available, Suction available, Patient being monitored and Timeout performed Patient Re-evaluated:Patient Re-evaluated prior to induction Oxygen Delivery Method: Simple face mask Placement Confirmation: positive ETCO2 Dental Injury: Teeth and Oropharynx as per pre-operative assessment

## 2022-11-23 NOTE — Anesthesia Postprocedure Evaluation (Signed)
Anesthesia Post Note  Patient: Janet Diaz  Procedure(s) Performed: TOTAL KNEE ARTHROPLASTY (Left: Knee)     Patient location during evaluation: PACU Anesthesia Type: Spinal Level of consciousness: oriented and awake and alert Pain management: pain level controlled Vital Signs Assessment: post-procedure vital signs reviewed and stable Respiratory status: spontaneous breathing, respiratory function stable and nonlabored ventilation Cardiovascular status: blood pressure returned to baseline and stable Postop Assessment: no headache, no backache, no apparent nausea or vomiting and spinal receding Anesthetic complications: no   No notable events documented.  Last Vitals:  Vitals:   11/23/22 1345 11/23/22 1400  BP: 125/80 127/71  Pulse: (!) 48 (!) 47  Resp: 11 15  Temp: (!) 36.4 C   SpO2: 100% 100%    Last Pain:  Vitals:   11/23/22 1400  TempSrc:   PainSc: 0-No pain                 Griselle Rufer A.

## 2022-11-23 NOTE — Progress Notes (Signed)
Orthopedic Tech Progress Note Patient Details:  KATELYN KROHN 06-01-1945 [id number]  CPM Left Knee CPM Left Knee: On Left Knee Flexion (Degrees): 40 Left Knee Extension (Degrees): 10  Post Interventions Patient Tolerated: Well  Darleen Crocker 11/23/2022, 1:52 PM

## 2022-11-23 NOTE — Discharge Instructions (Signed)
Janet Gross, MD Total Joint Specialist [facility] [address], [address] [place], [place] [address] [phone]  TOTAL KNEE REPLACEMENT POSTOPERATIVE DIRECTIONS    Knee Rehabilitation, Guidelines Following Surgery  Results after knee surgery are often greatly improved when you follow the exercise, range of motion and muscle strengthening exercises prescribed by your doctor. Safety measures are also important to protect the knee from further injury. If any of these exercises cause you to have increased pain or swelling in your knee joint, decrease the amount until you are comfortable again and slowly increase them. If you have problems or questions, call your caregiver or physical therapist for advice.   BLOOD CLOT PREVENTION Take 81 mg Aspirin two times a day for three weeks following surgery. Then take resume your normal dose of 81mg  aspirin once daily. You may resume your vitamins/supplements upon discharge from the hospital. Do not take any NSAIDs (Advil, Aleve, Ibuprofen, Meloxicam, etc.) for 3 weeks, while taking 81mg  Aspirin twice a day.   HOME CARE INSTRUCTIONS  Remove items at home which could result in a fall. This includes throw rugs or furniture in walking pathways.  ICE to the affected knee as much as tolerated. Icing helps control swelling. If the swelling is well controlled you will be more comfortable and rehab easier. Continue to use ice on the knee for pain and swelling from surgery. You may notice swelling that will progress down to the foot and ankle. This is normal after surgery. Elevate the leg when you are not up walking on it.    Continue to use the breathing machine which will help keep your temperature down. It is common for your temperature to cycle up and down following surgery, especially at night when you are not up moving around and exerting yourself. The breathing machine keeps your lungs expanded and your temperature down. Do not place  pillow under the operative knee, focus on keeping the knee straight while resting  DIET You may resume your previous home diet once you are discharged from the hospital.  DRESSING / WOUND CARE / SHOWERING Keep your bulky bandage on for 2 days. On the third post-operative day you may remove the Ace bandage and gauze. There is a waterproof adhesive bandage on your skin which will stay in place until your first follow-up appointment. Once you remove this you will not need to place another bandage You may begin showering 3 days following surgery, but do not submerge the incision under water.  ACTIVITY For the first 5 days, the key is rest and control of pain and swelling Do your home exercises twice a day starting on post-operative day 3. On the days you go to physical therapy, just do the home exercises once that day. You should rest, ice and elevate the leg for 50 minutes out of every hour. Get up and walk/stretch for 10 minutes per hour. After 5 days you can increase your activity slowly as tolerated. Walk with your walker as instructed. Use the walker until you are comfortable transitioning to a cane. Walk with the cane in the opposite hand of the operative leg. You may discontinue the cane once you are comfortable and walking steadily. Avoid periods of inactivity such as sitting longer than an hour when not asleep. This helps prevent blood clots.  You may discontinue the knee immobilizer once you are able to perform a straight leg raise while lying down. You may resume a sexual relationship in one month or when  given the OK by your doctor.  You may return to work once you are cleared by your doctor.  Do not drive a car for 6 weeks or until released by your surgeon.  Do not drive while taking narcotics.  TED HOSE STOCKINGS Wear the elastic stockings on both legs for three weeks following surgery during the day. You may remove them at night for sleeping.  WEIGHT BEARING Weight bearing as  tolerated with assist device (walker, cane, etc) as directed, use it as long as suggested by your surgeon or therapist, typically at least 4-6 weeks.  POSTOPERATIVE CONSTIPATION PROTOCOL Constipation - defined medically as fewer than three stools per week and severe constipation as less than one stool per week.  One of the most common issues patients have following surgery is constipation.  Even if you have a regular bowel pattern at home, your normal regimen is likely to be disrupted due to multiple reasons following surgery.  Combination of anesthesia, postoperative narcotics, change in appetite and fluid intake all can affect your bowels.  In order to avoid complications following surgery, here are some recommendations in order to help you during your recovery period.  Colace (docusate) - Pick up an over-the-counter form of Colace or another stool softener and take twice a day as long as you are requiring postoperative pain medications.  Take with a full glass of water daily.  If you experience loose stools or diarrhea, hold the colace until you stool forms back up. If your symptoms do not get better within 1 week or if they get worse, check with your doctor. Dulcolax (bisacodyl) - Pick up over-the-counter and take as directed by the product packaging as needed to assist with the movement of your bowels.  Take with a full glass of water.  Use this product as needed if not relieved by Colace only.  MiraLax (polyethylene glycol) - Pick up over-the-counter to have on hand. MiraLax is a solution that will increase the amount of water in your bowels to assist with bowel movements.  Take as directed and can mix with a glass of water, juice, soda, coffee, or tea. Take if you go more than two days without a movement. Do not use MiraLax more than once per day. Call your doctor if you are still constipated or irregular after using this medication for 7 days in a row.  If you continue to have problems with  postoperative constipation, please contact the office for further assistance and recommendations.  If you experience "the worst abdominal pain ever" or develop nausea or vomiting, please contact the office immediatly for further recommendations for treatment.  ITCHING If you experience itching with your medications, try taking only a single pain pill, or even half a pain pill at a time.  You can also use Benadryl over the counter for itching or also to help with sleep.   MEDICATIONS See your medication summary on the "After Visit Summary" that the nursing staff will review with you prior to discharge.  You may have some home medications which will be placed on hold until you complete the course of blood thinner medication.  It is important for you to complete the blood thinner medication as prescribed by your surgeon.  Continue your approved medications as instructed at time of discharge.  PRECAUTIONS If you experience chest pain or shortness of breath - call 911 immediately for transfer to the hospital emergency department.  If you develop a fever greater that 101 F, purulent drainage  from wound, increased redness or drainage from wound, foul odor from the wound/dressing, or calf pain - CONTACT YOUR SURGEON.                                                   FOLLOW-UP APPOINTMENTS Make sure you keep all of your appointments after your operation with your surgeon and caregivers. You should call the office at the above phone number and make an appointment for approximately two weeks after the date of your surgery or on the date instructed by your surgeon outlined in the "After Visit Summary".  RANGE OF MOTION AND STRENGTHENING EXERCISES  Rehabilitation of the knee is important following a knee injury or an operation. After just a few days of immobilization, the muscles of the thigh which control the knee become weakened and shrink (atrophy). Knee exercises are designed to build up the tone and strength  of the thigh muscles and to improve knee motion. Often times heat used for twenty to thirty minutes before working out will loosen up your tissues and help with improving the range of motion but do not use heat for the first two weeks following surgery. These exercises can be done on a training (exercise) mat, on the floor, on a table or on a bed. Use what ever works the best and is most comfortable for you Knee exercises include:  Leg Lifts - While your knee is still immobilized in a splint or cast, you can do straight leg raises. Lift the leg to 60 degrees, hold for 3 sec, and slowly lower the leg. Repeat 10-20 times 2-3 times daily. Perform this exercise against resistance later as your knee gets better.  Quad and Hamstring Sets - Tighten up the muscle on the front of the thigh (Quad) and hold for 5-10 sec. Repeat this 10-20 times hourly. Hamstring sets are done by pushing the foot backward against an object and holding for 5-10 sec. Repeat as with quad sets.  Leg Slides: Lying on your back, slowly slide your foot toward your buttocks, bending your knee up off the floor (only go as far as is comfortable). Then slowly slide your foot back down until your leg is flat on the floor again. Angel Wings: Lying on your back spread your legs to the side as far apart as you can without causing discomfort.  A rehabilitation program following serious knee injuries can speed recovery and prevent re-injury in the future due to weakened muscles. Contact your doctor or a physical therapist for more information on knee rehabilitation.   POST-OPERATIVE OPIOID TAPER INSTRUCTIONS: It is important to wean off of your opioid medication as soon as possible. If you do not need pain medication after your surgery it is ok to stop day one. Opioids include: Codeine, Hydrocodone(Norco, Vicodin), Oxycodone(Percocet, oxycontin) and hydromorphone amongst others.  Long term and even short term use of opiods can cause: Increased pain  response Dependence Constipation Depression Respiratory depression And more.  Withdrawal symptoms can include Flu like symptoms Nausea, vomiting And more Techniques to manage these symptoms Hydrate well Eat regular healthy meals Stay active Use relaxation techniques(deep breathing, meditating, yoga) Do Not substitute Alcohol to help with tapering If you have been on opioids for less than two weeks and do not have pain than it is ok to stop all together.  Plan to wean  off of opioids This plan should start within one week post op of your joint replacement. Maintain the same interval or time between taking each dose and first decrease the dose.  Cut the total daily intake of opioids by one tablet each day Next start to increase the time between doses. The last dose that should be eliminated is the evening dose.   IF YOU ARE TRANSFERRED TO A SKILLED REHAB FACILITY If the patient is transferred to a skilled rehab facility following release from the hospital, a list of the current medications will be sent to the facility for the patient to continue.  When discharged from the skilled rehab facility, please have the facility set up the patient's Home Health Physical Therapy prior to being released. Also, the skilled facility will be responsible for providing the patient with their medications at time of release from the facility to include their pain medication, the muscle relaxants, and their blood thinner medication. If the patient is still at the rehab facility at time of the two week follow up appointment, the skilled rehab facility will also need to assist the patient in arranging follow up appointment in our office and any transportation needs.  MAKE SURE YOU:  Understand these instructions.  Get help right away if you are not doing well or get worse.   DENTAL ANTIBIOTICS:  In most cases prophylactic antibiotics for Dental procdeures after total joint surgery are not  necessary.  Exceptions are as follows:  1. History of prior total joint infection  2. Severely immunocompromised (Organ Transplant, cancer chemotherapy, Rheumatoid biologic meds such as Humera)  3. Poorly controlled diabetes (A1C &gt; 8.0, blood glucose over 200)  If you have one of these conditions, contact your surgeon for an antibiotic prescription, prior to your dental procedure.    Pick up stool softner and laxative for home use following surgery while on pain medications. Do not submerge incision under water. Please use good hand washing techniques while changing dressing each day. May shower starting three days after surgery. Please use a clean towel to pat the incision dry following showers. Continue to use ice for pain and swelling after surgery. Do not use any lotions or creams on the incision until instructed by your surgeon.

## 2022-11-23 NOTE — Anesthesia Procedure Notes (Signed)
Anesthesia Regional Block: Adductor canal block   Pre-Anesthetic Checklist: , timeout performed,  Correct Patient, Correct Site, Correct Laterality,  Correct Procedure, Correct Position, site marked,  Risks and benefits discussed,  Surgical consent,  Pre-op evaluation,  At surgeon's request and post-op pain management  Laterality: Left  Prep: chloraprep       Needles:  Injection technique: Single-shot  Needle Type: Echogenic Stimulator Needle     Needle Length: 10cm  Needle Gauge: 21   Needle insertion depth: 8 cm   Additional Needles:   Procedures:,,,, ultrasound used (permanent image in chart),,    Narrative:  Start time: 11/23/2022 11:09 AM End time: 11/23/2022 11:14 AM  Performed by: Personally  Anesthesiologist: Mal Amabile, MD  Additional Notes: Timeout performed. Patient sedated. Relevant anatomy ID'd using [place]. Incremental 2-73ml injection of LA with frequent aspiration. Patient tolerated procedure well.

## 2022-11-23 NOTE — Evaluation (Signed)
Physical Therapy Evaluation Patient Details Name: CAOILINN VERTUCCI MRN: [id number] DOB: [birth year] Today's Date: 11/23/2022  History of Present Illness  77 yo female presents to therapy s/p L TKA on 11/23/2022 due to failure of conservative measures. 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  Clinical Impression      SHAMEEKA MCMICHAEL is a 77 y.o. female POD 0 s/p L TKA. Patient reports IND with mobility at baseline. Patient is now limited by functional impairments (see PT problem list below) and requires min A for bed mobility and mod A for transfers. Patient was able to ambulate 20 feet with RW and CGA level of assist and recliner close. Patient will benefit from continued skilled PT interventions to address impairments and progress towards PLOF. Acute PT will follow to progress mobility and stair training in preparation for safe discharge home with family support and OPPT services.     If plan is discharge home, recommend the following: A little help with walking and/or transfers;A little help with bathing/dressing/bathroom;Assistance with cooking/housework;Assist for transportation;Help with stairs or ramp for entrance   Can travel by private vehicle        Equipment Recommendations Rolling walker (2 wheels)  Recommendations for Other Services       Functional Status Assessment Patient has had a recent decline in their functional status and demonstrates the ability to make significant improvements in function in a reasonable and predictable amount of time.     Precautions / Restrictions Precautions Precautions: Knee;Fall Restrictions Weight Bearing Restrictions: No      Mobility  Bed Mobility Overal bed mobility: Needs Assistance Bed Mobility: Supine to Sit     Supine to sit: Min assist, HOB elevated, Used rails     General bed mobility comments: min A and cues with increased time     Transfers Overall transfer level: Needs assistance Equipment used: Rolling walker (2 wheels) Transfers: Sit to/from Stand Sit to Stand: Mod assist, From elevated surface           General transfer comment: cues for L LE WBAT, proper UE placement and power up to extension    Ambulation/Gait Ambulation/Gait assistance: Contact guard assist Gait Distance (Feet): 20 Feet Assistive device: Rolling walker (2 wheels) Gait Pattern/deviations: Step-to pattern, Antalgic, Trunk flexed Gait velocity: decreased     General Gait Details: B UE support at RW to offload L LE in stance phase, step to pattern with wide BOS and cues for sequencing  Stairs            Wheelchair Mobility     Tilt Bed    Modified Rankin (Stroke Patients Only)       Balance Overall balance assessment: Needs assistance, History of Falls Sitting-balance support: Feet supported Sitting balance-Leahy Scale: Good     Standing balance support: Bilateral upper extremity supported, During functional activity, Reliant on assistive device for balance Standing balance-Leahy Scale: Poor                               Pertinent Vitals/Pain Pain Assessment Pain Assessment: 0-10 Pain Score: 7  Pain Location: L knee and LE Pain Descriptors / Indicators: Constant, Discomfort, Grimacing, Operative site guarding, Penetrating Pain Intervention(s): Limited activity within patient's tolerance, Monitored during session, Premedicated before session, Repositioned, Ice applied, Patient requesting pain meds-RN notified, RN gave pain meds during session    Home Living  Family/patient expects to be discharged to:: Private residence Living Arrangements: Other relatives Available Help at Discharge: Family Type of Home: House Home Access: Stairs to enter;Ramped entrance Entrance Stairs-Rails: None Entrance Stairs-Number of Steps: 1   Home Layout: Two level;Laundry or work area in basement;Able to live on  main level with bedroom/bathroom Home Equipment: Gilmer Mor - single point;BSC/3in1      Prior Function Prior Level of Function : Independent/Modified Independent;History of Falls (last six months)             Mobility Comments: IND no AD with all ADLs, self care tasks       Extremity/Trunk Assessment        Lower Extremity Assessment Lower Extremity Assessment: LLE deficits/detail LLE Deficits / Details: ankle DF/PF 4/5; SLR < 10 degree lag LLE Sensation: WNL    Cervical / Trunk Assessment Cervical / Trunk Assessment: Normal  Communication   Communication Communication: No apparent difficulties  Cognition Arousal: Alert Behavior During Therapy: WFL for tasks assessed/performed Overall Cognitive Status: Within Functional Limits for tasks assessed                                          General Comments      Exercises     Assessment/Plan    PT Assessment Patient needs continued PT services  PT Problem List Decreased strength;Decreased range of motion;Decreased activity tolerance;Decreased balance;Decreased mobility;Decreased coordination;Pain       PT Treatment Interventions DME instruction;Gait training;Stair training;Functional mobility training;Therapeutic activities;Therapeutic exercise;Balance training;Neuromuscular re-education;Patient/family education;Modalities    PT Goals (Current goals can be found in the Care Plan section)  Acute Rehab PT Goals Patient Stated Goal: to be able to do what I was doing before and take trips with friends PT Goal Formulation: With patient Time For Goal Achievement: 12/07/22 Potential to Achieve Goals: Good    Frequency 7X/week     Co-evaluation               AM-PAC PT "6 Clicks" Mobility  Outcome Measure Help needed turning from your back to your side while in a flat bed without using bedrails?: A Little Help needed moving from lying on your back to sitting on the side of a flat bed without  using bedrails?: A Little Help needed moving to and from a bed to a chair (including a wheelchair)?: A Lot Help needed standing up from a chair using your arms (e.g., wheelchair or bedside chair)?: A Lot Help needed to walk in hospital room?: A Little Help needed climbing 3-5 steps with a railing? : A Lot 6 Click Score: 15    End of Session Equipment Utilized During Treatment: Gait belt Activity Tolerance: Patient limited by pain Patient left: in chair;with call bell/phone within reach;with family/visitor present;with chair alarm set Nurse Communication: Mobility status;Patient requests pain meds PT Visit Diagnosis: Unsteadiness on feet (R26.81);Other abnormalities of gait and mobility (R26.89);Repeated falls (R29.6);Muscle weakness (generalized) (M62.81);Difficulty in walking, not elsewhere classified (R26.2);Pain Pain - Right/Left: Left Pain - part of body: Knee;Leg    Time: 4401-0272 PT Time Calculation (min) (ACUTE ONLY): 28 min   Charges:   PT Evaluation $PT Eval Low Complexity: 1 Low PT Treatments $Therapeutic Activity: 8-22 mins PT General Charges $$ ACUTE PT VISIT: 1 Visit         Johnny Bridge, PT Acute Rehab   Jacqualyn Posey 11/23/2022, 5:54 PM

## 2022-11-24 ENCOUNTER — Encounter (HOSPITAL_COMMUNITY): Payer: Self-pay | Admitting: Orthopedic Surgery

## 2022-11-24 DIAGNOSIS — M1712 Unilateral primary osteoarthritis, left knee: Secondary | ICD-10-CM | POA: Diagnosis not present

## 2022-11-24 LAB — BASIC METABOLIC PANEL
Anion gap: 9 (ref 5–15)
BUN: 16 mg/dL (ref 8–23)
CO2: 25 mmol/L (ref 22–32)
Calcium: 7.9 mg/dL — ABNORMAL LOW (ref 8.9–10.3)
Chloride: 104 mmol/L (ref 98–111)
Creatinine, Ser: 0.96 mg/dL (ref 0.44–1.00)
GFR, Estimated: >60 mL/min (ref >60)
Glucose, Bld: 142 mg/dL — ABNORMAL HIGH (ref 70–99)
Potassium: 3.9 mmol/L (ref 3.5–5.1)
Sodium: 138 mmol/L (ref 135–145)

## 2022-11-24 LAB — CBC
HCT: 35.4 % — ABNORMAL LOW (ref 36.0–46.0)
Hemoglobin: 11.2 g/dL — ABNORMAL LOW (ref 12.0–15.0)
MCH: 31 pg (ref 26.0–34.0)
MCHC: 31.6 g/dL (ref 30.0–36.0)
MCV: 98.1 fL (ref 80.0–100.0)
Platelets: 141 10*3/uL — ABNORMAL LOW (ref 150–400)
RBC: 3.61 MIL/uL — ABNORMAL LOW (ref 3.87–5.11)
RDW: 13.2 % (ref 11.5–15.5)
WBC: 6.1 10*3/uL (ref 4.0–10.5)
nRBC: 0 % (ref 0.0–0.2)

## 2022-11-24 NOTE — Progress Notes (Signed)
Physical Therapy Treatment Patient Details Name: Janet Diaz MRN: [id number] DOB: 11/01/1945 Today's Date: 11/24/2022   History of Present Illness 77 yo female presents to therapy s/p L TKA on 11/23/2022 due to failure of conservative measures. 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    PT Comments  Pt agreeable to therapy/mobilizing, then began complaining of 9/10 pain. RN medicated during session. Pt able to take a few steps forward and back to chair, pain not incr with amb per pt. Anticipate slower progress in acute setting. Continue PT POC   If plan is discharge home, recommend the following: A little help with walking and/or transfers;A little help with bathing/dressing/bathroom;Assistance with cooking/housework;Assist for transportation;Help with stairs or ramp for entrance   Can travel by private vehicle        Equipment Recommendations  Rolling walker (2 wheels)    Recommendations for Other Services       Precautions / Restrictions Precautions Precautions: Knee;Fall Restrictions Weight Bearing Restrictions: No     Mobility  Bed Mobility Overal bed mobility: Needs Assistance Bed Mobility: Supine to Sit     Supine to sit: Min assist, HOB elevated, Used rails     General bed mobility comments: min A and cues with increased time    Transfers Overall transfer level: Needs assistance Equipment used: Rolling walker (2 wheels) Transfers: Sit to/from Stand, Bed to chair/wheelchair/BSC Sit to Stand: Mod assist, From elevated surface   Step pivot transfers: Min assist, Mod assist       General transfer comment: cues for L LE WBAT, proper UE placement and power up to stand    Ambulation/Gait                   Stairs             Wheelchair Mobility     Tilt Bed    Modified Rankin (Stroke Patients Only)       Balance Overall balance assessment: Needs assistance,  History of Falls Sitting-balance support: Feet supported Sitting balance-Leahy Scale: Good     Standing balance support: Bilateral upper extremity supported, During functional activity, Reliant on assistive device for balance Standing balance-Leahy Scale: Poor                              Cognition Arousal: Alert Behavior During Therapy: WFL for tasks assessed/performed Overall Cognitive Status: Within Functional Limits for tasks assessed                                          Exercises      General Comments        Pertinent Vitals/Pain Pain Assessment Pain Assessment: 0-10 Pain Score: 6  Pain Location: L knee and LE Pain Descriptors / Indicators: Constant, Discomfort, Grimacing, Operative site guarding, Penetrating Pain Intervention(s): Limited activity within patient's tolerance, Monitored during session, Premedicated before session    Home Living                          Prior Function            PT Goals (current goals can now be found in the care plan section) Acute Rehab PT Goals Patient Stated Goal: to be able to  do what I was doing before and take trips with friends PT Goal Formulation: With patient Time For Goal Achievement: 12/07/22 Potential to Achieve Goals: Good Progress towards PT goals: Progressing toward goals    Frequency    7X/week      PT Plan      Co-evaluation              AM-PAC PT "6 Clicks" Mobility   Outcome Measure  Help needed turning from your back to your side while in a flat bed without using bedrails?: A Little Help needed moving from lying on your back to sitting on the side of a flat bed without using bedrails?: A Little Help needed moving to and from a bed to a chair (including a wheelchair)?: A Lot Help needed standing up from a chair using your arms (e.g., wheelchair or bedside chair)?: A Lot Help needed to walk in hospital room?: A Little Help needed climbing 3-5 steps  with a railing? : A Lot 6 Click Score: 15    End of Session Equipment Utilized During Treatment: Gait belt Activity Tolerance: Patient limited by pain Patient left: in chair;with call bell/phone within reach;with family/visitor present;with chair alarm set Nurse Communication: Mobility status PT Visit Diagnosis: Unsteadiness on feet (R26.81);Other abnormalities of gait and mobility (R26.89);Repeated falls (R29.6);Muscle weakness (generalized) (M62.81);Difficulty in walking, not elsewhere classified (R26.2);Pain Pain - Right/Left: Left Pain - part of body: Knee;Leg     Time: 1200-1223 PT Time Calculation (min) (ACUTE ONLY): 23 min  Charges:    $Therapeutic Activity: 8-22 mins PT General Charges $$ ACUTE PT VISIT: 1 Visit                     Lai Hendriks, PT  Acute Rehab Dept [facility]) [phone]  11/24/2022    [facility] 11/24/2022, 1:04 PM

## 2022-11-24 NOTE — Progress Notes (Signed)
PT TX NOTE  11/24/22 1400  PT Visit Information  Last PT Received On 11/24/22  Assistance Needed Pt progressing this session, pain controlled with amb. Will continue efforts to mobilize in preparation for d/c; pt states she has no one available to assist at home today, will benefit from another day to work with PT to improve safety with mobility  History of Present Illness 77 yo female presents to therapy s/p L TKA on 11/23/2022 due to failure of conservative measures. 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  Subjective Data  Patient Stated Goal to be able to do what I was doing before and take trips with friends  Precautions  Precautions Knee;Fall  Restrictions  Weight Bearing Restrictions No  Pain Assessment  Pain Assessment 0-10  Pain Score 5  Pain Location L knee and LE  Pain Descriptors / Indicators Constant;Discomfort;Grimacing;Operative site guarding;Penetrating  Cognition  Arousal Alert  Behavior During Therapy WFL for tasks assessed/performed  Overall Cognitive Status Within Functional Limits for tasks assessed  Bed Mobility  Bed Mobility Supine to Sit  Transfers  Overall transfer level Needs assistance  Equipment used Rolling walker (2 wheels)  Transfers Sit to/from Stand;Bed to chair/wheelchair/BSC  Sit to Stand Mod assist  General transfer comment 2 attempts to come to stand; cues for L LE WBAT, proper UE placement and power up to stand.  Ambulation/Gait  Ambulation/Gait assistance Contact guard assist  Gait Distance (Feet) 40 Feet  Assistive device Rolling walker (2 wheels)  Gait Pattern/deviations Step-to pattern;Decreased stance time - left  General Gait Details cues for sequence and RW position. CGA to steady; decr reliance on UEs and improved wt shift to LLE in stance  Gait velocity decreased  Balance  Overall balance assessment Needs assistance;History of Falls  Sitting-balance support  Feet supported  Sitting balance-Leahy Scale Good  Standing balance support Bilateral upper extremity supported;During functional activity;Reliant on assistive device for balance  Standing balance-Leahy Scale Poor  Total Joint Exercises  Ankle Circles/Pumps AROM;Both;10 reps  Quad Sets AROM;Both;10 reps  Heel Slides AAROM;Left;10 reps  PT - End of Session  Equipment Utilized During Treatment Gait belt  Activity Tolerance Patient tolerated treatment well  Patient left in chair;with call bell/phone within reach;with chair alarm set  Nurse Communication Mobility status  CPM Left Knee  CPM Left Knee Off   PT - Assessment/Plan  PT Visit Diagnosis Unsteadiness on feet (R26.81);Other abnormalities of gait and mobility (R26.89);Repeated falls (R29.6);Muscle weakness (generalized) (M62.81);Difficulty in walking, not elsewhere classified (R26.2);Pain  Pain - Right/Left Left  Pain - part of body Knee;Leg  PT Frequency (ACUTE ONLY) 7X/week  Follow Up Recommendations Follow physician's recommendations for discharge plan and follow up therapies  Patient can return home with the following A little help with walking and/or transfers;A little help with bathing/dressing/bathroom;Assistance with cooking/housework;Assist for transportation;Help with stairs or ramp for entrance  PT equipment Rolling walker (2 wheels)  AM-PAC PT "6 Clicks" Mobility Outcome Measure (Version 2)  Help needed turning from your back to your side while in a flat bed without using bedrails? 3  Help needed moving from lying on your back to sitting on the side of a flat bed without using bedrails? 3  Help needed moving to and from a bed to a chair (including a wheelchair)? 3  Help needed standing up from a chair using your arms (e.g., wheelchair or bedside chair)? 3  Help needed to walk in hospital  room? 3  Help needed climbing 3-5 steps with a railing?  2  6 Click Score 17  Consider Recommendation of Discharge To: Home with [facility]   Progressive Mobility  What is the highest level of mobility based on the progressive mobility assessment? Level 5 (Walks with assist in room/hall) - Balance while stepping forward/back and can walk in room with assist - Complete  PT Goal Progression  Progress towards PT goals Progressing toward goals  Acute Rehab PT Goals  PT Goal Formulation With patient  Time For Goal Achievement 12/07/22  Potential to Achieve Goals Good  PT Time Calculation  PT Start Time (ACUTE ONLY) 1353  PT Stop Time (ACUTE ONLY) 1415  PT Time Calculation (min) (ACUTE ONLY) 22 min  PT General Charges  $$ ACUTE PT VISIT 1 Visit  PT Treatments  $Gait Training 8-22 mins

## 2022-11-24 NOTE — Progress Notes (Signed)
Subjective: 1 Day Post-Op Procedure(s) (LRB): TOTAL KNEE ARTHROPLASTY (Left) Patient reports pain as moderate.   Patient seen in rounds by Dr. Lequita Halt. Patient had issues with pain control last night, sounds as though block wore off early. Slightly improved this AM. No other issues overnight.  We will continue therapy today.  Objective: Vital signs in last 24 hours: Temp:  [97.5 F (36.4 C)-98.3 F (36.8 C)] 97.9 F (36.6 C) (11/12 0526) Pulse Rate:  [47-69] 63 (11/12 0526) Resp:  [8-17] 17 (11/12 0526) BP: (122-153)/(53-100) 148/74 (11/12 0526) SpO2:  [92 %-100 %] 100 % (11/12 0526) Weight:  [94.2 kg] 94.2 kg (11/11 1028)  Intake/Output from previous day:  Intake/Output Summary (Last 24 hours) at 11/24/2022 0855 Last data filed at 11/24/2022 0600 Gross per 24 hour  Intake 2720 ml  Output 1675 ml  Net 1045 ml     Intake/Output this shift: No intake/output data recorded.  Labs: Recent Labs    11/24/22 0337  HGB 11.2*   Recent Labs    11/24/22 0337  WBC 6.1  RBC 3.61*  HCT 35.4*  PLT 141*   Recent Labs    11/24/22 0337  NA 138  K 3.9  CL 104  CO2 25  BUN 16  CREATININE 0.96  GLUCOSE 142*  CALCIUM 7.9*   No results for input(s): "LABPT", "INR" in the last 72 hours.  Exam: General - Patient is Alert and Oriented Extremity - Neurologically intact Neurovascular intact Sensation intact distally Dorsiflexion/Plantar flexion intact Dressing - dressing C/D/I Motor Function - intact, moving foot and toes well on exam.   Past Medical History:  Diagnosis Date   Acute bronchitis    Allergic rhinosinusitis    Anxiety and depression    Basal cell carcinoma    Nose   Candida infection of flexural skin    Carpal tunnel syndrome    Complication of anesthesia    hard time waking up , And BP went upwith a finger surgery,   Dyslipidemia    Dyspnea    GERD (gastroesophageal reflux disease)    Laryngitis    LBBB (left bundle branch block)    OAB  (overactive bladder)    Osteoarthritis of right knee    Pedal edema    PONV (postoperative nausea and vomiting)    Pre-diabetes    Vitamin B 12 deficiency    Vitamin D deficiency     Assessment/Plan: 1 Day Post-Op Procedure(s) (LRB): TOTAL KNEE ARTHROPLASTY (Left) Principal Problem:   Osteoarthritis of knee Active Problems:   Primary osteoarthritis of left knee  Estimated body mass index is 40.54 kg/m as calculated from the following:   Height as of this encounter: 5' (1.524 m).   Weight as of this encounter: 94.2 kg. Advance diet Up with therapy D/C IV fluids   Patient's anticipated LOS is less than 2 midnights, meeting these requirements: - Lives within 1 hour of care - Has a competent adult at home to recover with post-op recover - NO history of             - Chronic pain requiring opioids             - Diabetes             - Coronary Artery Disease             - Heart failure             - Heart attack             -  Stroke             - DVT/VTE             - Cardiac arrhythmia             - Respiratory Failure/COPD             - Renal failure             - Anemia             - Advanced Liver disease  DVT Prophylaxis - Aspirin Weight bearing as tolerated. Continue therapy.  Plan is to go Home after hospital stay. Possible discharge later today if pain is well controlled and she is meeting her goals with therapy. Will most likely need another day.   Arther Abbott, PA-C Orthopedic Surgery [phone] 11/24/2022, 8:55 AM

## 2022-11-24 NOTE — TOC Transition Note (Signed)
Transition of Care [facility]) - CM/SW Discharge Note   Patient Details  Name: Janet Diaz MRN: [id number] Date of Birth: [birth year]  [facility] CM/SW Contact:  Amada Jupiter, LCSW Phone Number: 11/24/2022, 10:23 AM   Clinical Narrative:     Met with pt who reports she does need a RW and has no DME agency preference - order placed with [employer] for delivery to room.  OPPT already arranged with [facility].  No further TOC needs.    Final next level of care: OP Rehab Barriers to Discharge: No Barriers Identified   Patient Goals and CMS Choice      Discharge Placement                         Discharge Plan and Services Additional resources added to the After Visit Summary for                  DME Arranged: Walker rolling DME Agency: [employer] Date DME Agency Contacted: 11/24/22 Time DME Agency Contacted: 0930 Representative spoke with at DME Agency: Yvonna Alanis            Social Determinants of Health (SDOH) Interventions SDOH Screenings   Food Insecurity: No Food Insecurity (11/23/2022)  Housing: Low Risk  (11/23/2022)  Transportation Needs: No Transportation Needs (11/23/2022)  Utilities: Not At Risk (11/23/2022)  Tobacco Use: Low Risk  (11/23/2022)     Readmission Risk Interventions     No data to display

## 2022-11-24 NOTE — Care Management Obs Status (Signed)
MEDICARE OBSERVATION STATUS NOTIFICATION   Patient Details  Name: Janet Diaz MRN: [id number] Date of Birth: 1945/06/22   Medicare Observation Status Notification Given:  Yes    Amada Jupiter, LCSW 11/24/2022, 3:02 PM

## 2022-11-25 DIAGNOSIS — M1712 Unilateral primary osteoarthritis, left knee: Secondary | ICD-10-CM | POA: Diagnosis not present

## 2022-11-25 LAB — CBC
HCT: 35.4 % — ABNORMAL LOW (ref 36.0–46.0)
Hemoglobin: 11.1 g/dL — ABNORMAL LOW (ref 12.0–15.0)
MCH: 30.8 pg (ref 26.0–34.0)
MCHC: 31.4 g/dL (ref 30.0–36.0)
MCV: 98.3 fL (ref 80.0–100.0)
Platelets: 145 10*3/uL — ABNORMAL LOW (ref 150–400)
RBC: 3.6 MIL/uL — ABNORMAL LOW (ref 3.87–5.11)
RDW: 13.2 % (ref 11.5–15.5)
WBC: 7.7 10*3/uL (ref 4.0–10.5)
nRBC: 0 % (ref 0.0–0.2)

## 2022-11-25 NOTE — Progress Notes (Signed)
Orthopedic Tech Progress Note Patient Details:  Janet Diaz 1946-01-01 [id number]  CPM Left Knee CPM Left Knee: Off Left Knee Flexion (Degrees): 40 Left Knee Extension (Degrees): 10  Post Interventions Patient Tolerated: Well Instructions Provided: Adjustment of device, Care of device  [name] 11/25/2022, 5:50 PM

## 2022-11-25 NOTE — Progress Notes (Signed)
Orthopedic Tech Progress Note Patient Details:  Janet Diaz Aug 05, 1945 [id number]  CPM Left Knee CPM Left Knee: On Left Knee Flexion (Degrees): 40 Left Knee Extension (Degrees): 10  Post Interventions Patient Tolerated: Well Instructions Provided: Adjustment of device, Care of device  [name] 11/25/2022, 3:53 PM

## 2022-11-25 NOTE — Progress Notes (Signed)
   Subjective: 2 Days Post-Op Procedure(s) (LRB): TOTAL KNEE ARTHROPLASTY (Left) Patient seen in rounds by Dr. Lequita Halt. Patient is well, and has had no acute complaints or problems. Slowly progressing. Patient reports pain as moderate.    Objective: Vital signs in last 24 hours: Temp:  [98.5 F (36.9 C)-99.1 F (37.3 C)] 99.1 F (37.3 C) (11/13 0511) Pulse Rate:  [77-97] 97 (11/13 0511) Resp:  [15-18] 16 (11/13 0511) BP: (124-133)/(57-73) 129/73 (11/13 0511) SpO2:  [95 %] 95 % (11/13 0511)  Intake/Output from previous day:  Intake/Output Summary (Last 24 hours) at 11/25/2022 0745 Last data filed at 11/25/2022 0600 Gross per 24 hour  Intake 420 ml  Output 900 ml  Net -480 ml    Intake/Output this shift: No intake/output data recorded.  Labs: Recent Labs    11/24/22 0337 11/25/22 0339  HGB 11.2* 11.1*   Recent Labs    11/24/22 0337 11/25/22 0339  WBC 6.1 7.7  RBC 3.61* 3.60*  HCT 35.4* 35.4*  PLT 141* 145*   Recent Labs    11/24/22 0337  NA 138  K 3.9  CL 104  CO2 25  BUN 16  CREATININE 0.96  GLUCOSE 142*  CALCIUM 7.9*   No results for input(s): "LABPT", "INR" in the last 72 hours.  Exam: General - Patient is Alert and Oriented Extremity - Neurologically intact Neurovascular intact Sensation intact distally Dorsiflexion/Plantar flexion intact Dressing/Incision - clean, dry, no drainage Motor Function - intact, moving foot and toes well on exam.  Past Medical History:  Diagnosis Date   Acute bronchitis    Allergic rhinosinusitis    Anxiety and depression    Basal cell carcinoma    Nose   Candida infection of flexural skin    Carpal tunnel syndrome    Complication of anesthesia    hard time waking up , And BP went upwith a finger surgery,   Dyslipidemia    Dyspnea    GERD (gastroesophageal reflux disease)    Laryngitis    LBBB (left bundle branch block)    OAB (overactive bladder)    Osteoarthritis of right knee    Pedal edema     PONV (postoperative nausea and vomiting)    Pre-diabetes    Vitamin B 12 deficiency    Vitamin D deficiency     Assessment/Plan: 2 Days Post-Op Procedure(s) (LRB): TOTAL KNEE ARTHROPLASTY (Left) Principal Problem:   Osteoarthritis of knee Active Problems:   Primary osteoarthritis of left knee  Estimated body mass index is 40.54 kg/m as calculated from the following:   Height as of this encounter: 5' (1.524 m).   Weight as of this encounter: 94.2 kg.  DVT Prophylaxis - Aspirin Weight-bearing as tolerated.  Continue physical therapy. Possible discharge home today if meeting patient goals. Scheduled for OPPT at [facility] in [place].  Alfonzo Feller, PA-C Orthopedic Surgery 11/25/2022, 7:45 AM

## 2022-11-25 NOTE — Progress Notes (Signed)
PT NOTE  11/25/22 1600  PT Visit Information  Last PT Received On 11/25/22  Assistance Needed Pt with decr sats earlier requiring 1-2L [place] O2. Pt SpO2=93-94% on RA during PT session. Pt progressing, incr gait distance however does fatigue easily.  Dtr present for session, pt  states she feels she needs another day to work on mobility. Discussed with pt that she needs to plan on d/c tomorrow. RN aware  History of Present Illness 77 yo female presents to therapy s/p L TKA on 11/23/2022 due to failure of conservative measures. 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  Subjective Data  Patient Stated Goal to be able to do what I was doing before and take trips with friends  Precautions  Precautions Knee;Fall  Restrictions  Weight Bearing Restrictions No  Pain Assessment  Pain Assessment 0-10  Pain Score 5  Pain Location L knee and LE  Pain Descriptors / Indicators Constant;Discomfort;Grimacing;Operative site guarding;Penetrating  Cognition  Arousal Alert  Behavior During Therapy WFL for tasks assessed/performed  Overall Cognitive Status Within Functional Limits for tasks assessed  Bed Mobility  Overal bed mobility Needs Assistance  Bed Mobility Sit to Supine  Sit to supine Min assist  General bed mobility comments assist with LLE and positioning once in supine, incr time and effort, cues for technique  Transfers  Overall transfer level Needs assistance  Equipment used Rolling walker (2 wheels)  Transfers Sit to/from Stand;Bed to chair/wheelchair/BSC  Sit to Stand Min assist;Contact guard assist  General transfer comment cues for hand placement and LLE position  Ambulation/Gait  Ambulation/Gait assistance Contact guard assist  Gait Distance (Feet) 40 Feet  Assistive device Rolling walker (2 wheels)  Gait Pattern/deviations Step-to pattern;Decreased stance time - left  General Gait Details cues for sequence and  RW position. CGA  to steady; slow but steady gait  Gait velocity decreased  Balance  Overall balance assessment Needs assistance;History of Falls  Sitting-balance support Feet supported  Sitting balance-Leahy Scale Good  Standing balance support Bilateral upper extremity supported;During functional activity;Reliant on assistive device for balance  Standing balance-Leahy Scale Poor  Total Joint Exercises  Ankle Circles/Pumps AROM;Both;10 reps  Quad Sets AROM;Both;10 reps  Heel Slides AAROM;Left;10 reps  Straight Leg Raises AAROM;Left;10 reps  PT - End of Session  Equipment Utilized During Treatment Gait belt  Activity Tolerance Patient tolerated treatment well  Patient left with call bell/phone within reach;in bed;with bed alarm set;with family/visitor present  Nurse Communication Mobility status  CPM Left Knee  CPM Left Knee Off   PT - Assessment/Plan  PT Visit Diagnosis Unsteadiness on feet (R26.81);Other abnormalities of gait and mobility (R26.89);Repeated falls (R29.6);Muscle weakness (generalized) (M62.81);Difficulty in walking, not elsewhere classified (R26.2);Pain  Pain - Right/Left Left  Pain - part of body Knee;Leg  PT Frequency (ACUTE ONLY) 7X/week  Follow Up Recommendations Follow physician's recommendations for discharge plan and follow up therapies  Patient can return home with the following A little help with walking and/or transfers;A little help with bathing/dressing/bathroom;Assistance with cooking/housework;Assist for transportation;Help with stairs or ramp for entrance  PT equipment Rolling walker (2 wheels)  AM-PAC PT "6 Clicks" Mobility Outcome Measure (Version 2)  Help needed turning from your back to your side while in a flat bed without using bedrails? 3  Help needed moving from lying on your back to sitting on the side of a flat bed without using bedrails? 3  Help needed moving to  and from a bed to a chair (including a wheelchair)? 3  Help needed standing up  from a chair using your arms (e.g., wheelchair or bedside chair)? 3  Help needed to walk in hospital room? 3  Help needed climbing 3-5 steps with a railing?  2  6 Click Score 17  Consider Recommendation of Discharge To: Home with [facility]  Progressive Mobility  What is the highest level of mobility based on the progressive mobility assessment? Level 5 (Walks with assist in room/hall) - Balance while stepping forward/back and can walk in room with assist - Complete  PT Goal Progression  Progress towards PT goals Progressing toward goals  Acute Rehab PT Goals  PT Goal Formulation With patient  Time For Goal Achievement 12/07/22  Potential to Achieve Goals Good  PT Time Calculation  PT Start Time (ACUTE ONLY) 1441  PT Stop Time (ACUTE ONLY) 1510  PT Time Calculation (min) (ACUTE ONLY) 29 min  PT General Charges  $$ ACUTE PT VISIT 1 Visit  PT Treatments  $Gait Training 8-22 mins  $Therapeutic Exercise 8-22 mins

## 2022-11-25 NOTE — Progress Notes (Signed)
Physical Therapy Treatment Patient Details Name: Janet Diaz MRN: [id number] DOB: 1945/05/09 Today's Date: 11/25/2022   History of Present Illness 77 yo female presents to therapy s/p L TKA on 11/23/2022 due to failure of conservative measures. 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    PT Comments  Pt progressing toward goals. Continues to state she does not have anyone to stay with her at home (again today). Discussed with pt that this was a planned surgery and the ortho office advised to have assistance prearranged. Will see for  a second PT session. Encouraged pt to speak with her dtr. Pt could likely d/c today if she has caregiver supervision at home.     If plan is discharge home, recommend the following: A little help with walking and/or transfers;A little help with bathing/dressing/bathroom;Assistance with cooking/housework;Assist for transportation;Help with stairs or ramp for entrance   Can travel by private vehicle        Equipment Recommendations  Rolling walker (2 wheels)    Recommendations for Other Services       Precautions / Restrictions Precautions Precautions: Knee;Fall Restrictions Weight Bearing Restrictions: No     Mobility  Bed Mobility               General bed mobility comments: up in room with NT    Transfers Overall transfer level: Needs assistance Equipment used: Rolling walker (2 wheels) Transfers: Sit to/from Stand, Bed to chair/wheelchair/BSC Sit to Stand: Min assist           General transfer comment: cues for hand placement and LLE position    Ambulation/Gait Ambulation/Gait assistance: Contact guard assist, Min assist Gait Distance (Feet): 30 Feet Assistive device: Rolling walker (2 wheels) Gait Pattern/deviations: Step-to pattern, Decreased stance time - left Gait velocity: decreased     General Gait Details: cues for sequence and RW position.  CGA to min assist to steady; decr reliance on UEs and improved wt shift to LLE in stance   Stairs             Wheelchair Mobility     Tilt Bed    Modified Rankin (Stroke Patients Only)       Balance Overall balance assessment: Needs assistance, History of Falls Sitting-balance support: Feet supported Sitting balance-Leahy Scale: Good     Standing balance support: Bilateral upper extremity supported, During functional activity, Reliant on assistive device for balance Standing balance-Leahy Scale: Poor                              Cognition Arousal: Alert Behavior During Therapy: WFL for tasks assessed/performed Overall Cognitive Status: Within Functional Limits for tasks assessed                                          Exercises Total Joint Exercises Ankle Circles/Pumps: AROM, Both, 10 reps    General Comments        Pertinent Vitals/Pain Pain Assessment Pain Assessment: 0-10 Pain Score: 4  Pain Location: L knee and LE Pain Descriptors / Indicators: Constant, Discomfort, Grimacing, Operative site guarding, Penetrating Pain Intervention(s): Limited activity within patient's tolerance, Monitored during session, Premedicated before session, Repositioned, Ice applied    Home Living  Prior Function            PT Goals (current goals can now be found in the care plan section) Acute Rehab PT Goals Patient Stated Goal: to be able to do what I was doing before and take trips with friends PT Goal Formulation: With patient Time For Goal Achievement: 12/07/22 Potential to Achieve Goals: Good Progress towards PT goals: Progressing toward goals    Frequency    7X/week      PT Plan      Co-evaluation              AM-PAC PT "6 Clicks" Mobility   Outcome Measure  Help needed turning from your back to your side while in a flat bed without using bedrails?: A Little Help needed moving  from lying on your back to sitting on the side of a flat bed without using bedrails?: A Little Help needed moving to and from a bed to a chair (including a wheelchair)?: A Little Help needed standing up from a chair using your arms (e.g., wheelchair or bedside chair)?: A Little Help needed to walk in hospital room?: A Little Help needed climbing 3-5 steps with a railing? : A Lot 6 Click Score: 17    End of Session Equipment Utilized During Treatment: Gait belt Activity Tolerance: Patient tolerated treatment well Patient left: in chair;with call bell/phone within reach;with chair alarm set Nurse Communication: Mobility status PT Visit Diagnosis: Unsteadiness on feet (R26.81);Other abnormalities of gait and mobility (R26.89);Repeated falls (R29.6);Muscle weakness (generalized) (M62.81);Difficulty in walking, not elsewhere classified (R26.2);Pain Pain - Right/Left: Left Pain - part of body: Knee;Leg     Time: 4098-1191 PT Time Calculation (min) (ACUTE ONLY): 20 min  Charges:    $Gait Training: 8-22 mins PT General Charges $$ ACUTE PT VISIT: 1 Visit                     Mikenzi Raysor, PT  Acute Rehab Dept [facility]) [phone]  11/25/2022    [facility] 11/25/2022, 12:13 PM

## 2022-11-26 DIAGNOSIS — M1712 Unilateral primary osteoarthritis, left knee: Secondary | ICD-10-CM | POA: Diagnosis not present

## 2022-11-26 MED ORDER — ONDANSETRON HCL 4 MG PO TABS: 4.0000 mg | ORAL_TABLET | Freq: Four times a day (QID) | ORAL | 0 refills | Status: AC | PRN

## 2022-11-26 MED ORDER — METHOCARBAMOL 500 MG PO TABS: 500.0000 mg | ORAL_TABLET | Freq: Four times a day (QID) | ORAL | 0 refills | Status: AC | PRN

## 2022-11-26 MED ORDER — OXYCODONE HCL 5 MG PO TABS: 5.0000 mg | ORAL_TABLET | Freq: Four times a day (QID) | ORAL | 0 refills | Status: AC | PRN

## 2022-11-26 MED ORDER — ASPIRIN 81 MG PO CHEW: 81.0000 mg | CHEWABLE_TABLET | Freq: Two times a day (BID) | ORAL | 0 refills | Status: AC

## 2022-11-26 NOTE — Progress Notes (Signed)
Physical Therapy Treatment Patient Details Name: Janet Diaz MRN: [id number] DOB: 07-11-1945 Today's Date: 11/26/2022   History of Present Illness 77 yo female presents to therapy s/p L TKA on 11/23/2022 due to failure of conservative measures. 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    PT Comments  Pt received in bed this pm, no ice to left knee and remains red and warm to touch. Session focused on education provided to pt and pt's daughter regarding importance of proper positioning, ice, mobility, and there ex program (packet issued). Pt required encouragement and explanation on sitting up in chair and completing L LE AROM to facilitate knee flex/ext. Pt has definitely benefited from an extra day to increase gait distance with RW ~34ft and understand proper post op care for functional progression. Will plan to see pt again in am for PT.    If plan is discharge home, recommend the following: A little help with walking and/or transfers;A little help with bathing/dressing/bathroom;Assistance with cooking/housework;Assist for transportation;Help with stairs or ramp for entrance   Can travel by private vehicle        Equipment Recommendations  Rolling walker (2 wheels)    Recommendations for Other Services       Precautions / Restrictions Precautions Precautions: Knee;Fall Precaution Booklet Issued: Yes (comment) Precaution Comments: Reviewed packet with pt and daughter with good understanding Restrictions Weight Bearing Restrictions: Yes Other Position/Activity Restrictions: WBAT L LE     Mobility  Bed Mobility Overal bed mobility: Needs Assistance Bed Mobility: Supine to Sit     Supine to sit: HOB elevated, Used rails, Contact guard          Transfers Overall transfer level: Needs assistance Equipment used: Rolling walker (2 wheels) Transfers: Sit to/from Stand Sit to Stand: Min assist,  Contact guard assist           General transfer comment: cues for hand placement and LLE position    Ambulation/Gait Ambulation/Gait assistance: Contact guard assist Gait Distance (Feet): 65 Feet Assistive device: Rolling walker (2 wheels) Gait Pattern/deviations: Step-to pattern, Decreased stance time - left Gait velocity: decreased     General Gait Details: cues for sequence and RW position. CGA  to steady; slow but steady gait   Stairs             Wheelchair Mobility     Tilt Bed    Modified Rankin (Stroke Patients Only)       Balance Overall balance assessment: Needs assistance, History of Falls Sitting-balance support: Feet supported Sitting balance-Leahy Scale: Good     Standing balance support: Bilateral upper extremity supported, During functional activity, Reliant on assistive device for balance Standing balance-Leahy Scale: Fair Standing balance comment: No LOB while ambulating with RW in hallway                            Cognition Arousal: Alert Behavior During Therapy: WFL for tasks assessed/performed Overall Cognitive Status: Within Functional Limits for tasks assessed                                          Exercises Total Joint Exercises Ankle Circles/Pumps: AROM, Both, 10 reps Quad Sets: AROM, Both, 5 reps Straight Leg Raises: AAROM, Left Long Arc Quad: AAROM, Left, 5 reps, Seated  General Comments General comments (skin integrity, edema, etc.): L LE remains red and warm to touch. No ice given to pt after last session request, therefore this therapist placed ice to L knee post session in chair      Pertinent Vitals/Pain Pain Assessment Pain Assessment: 0-10 Pain Score: 6  Pain Location: L knee and LE Pain Descriptors / Indicators: Constant, Discomfort, Grimacing, Operative site guarding, Penetrating Pain Intervention(s): Patient requesting pain meds-RN notified, Ice applied    Home Living                           Prior Function            PT Goals (current goals can now be found in the care plan section) Acute Rehab PT Goals Patient Stated Goal: to be able to do what I was doing before and take trips with friends Progress towards PT goals: Progressing toward goals    Frequency    7X/week      PT Plan      Co-evaluation              AM-PAC PT "6 Clicks" Mobility   Outcome Measure  Help needed turning from your back to your side while in a flat bed without using bedrails?: A Little Help needed moving from lying on your back to sitting on the side of a flat bed without using bedrails?: A Little Help needed moving to and from a bed to a chair (including a wheelchair)?: A Little Help needed standing up from a chair using your arms (e.g., wheelchair or bedside chair)?: A Little Help needed to walk in hospital room?: A Little Help needed climbing 3-5 steps with a railing? : A Lot 6 Click Score: 17    End of Session Equipment Utilized During Treatment: Gait belt Activity Tolerance: Patient tolerated treatment well Patient left: in chair;with call bell/phone within reach;with chair alarm set;with family/visitor present Nurse Communication: Mobility status PT Visit Diagnosis: Unsteadiness on feet (R26.81);Other abnormalities of gait and mobility (R26.89);Repeated falls (R29.6);Muscle weakness (generalized) (M62.81);Difficulty in walking, not elsewhere classified (R26.2);Pain Pain - Right/Left: Left Pain - part of body: Knee;Leg     Time: 1528-1600 PT Time Calculation (min) (ACUTE ONLY): 32 min  Charges:    $Gait Training: 8-22 mins $Therapeutic Exercise: 8-22 mins PT General Charges $$ ACUTE PT VISIT: 1 Visit                    Janet Diaz, PTA  Janet Diaz 11/26/2022, 4:19 PM

## 2022-11-26 NOTE — Progress Notes (Signed)
   Subjective: 3 Days Post-Op Procedure(s) (LRB): TOTAL KNEE ARTHROPLASTY (Left) Patient seen in rounds by Dr. Lequita Halt. Patient reports pain as moderate.   Patient is well, and has had no acute complaints or problems. Reports she is slowly improving. Still having difficulty with first getting up out of bed and generalized weakness. She feels ready to head home today.  Objective: Vital signs in last 24 hours: Temp:  [98.6 F (37 C)-100.7 F (38.2 C)] 99 F (37.2 C) (11/14 0452) Pulse Rate:  [93-104] 93 (11/14 0452) Resp:  [17-18] 17 (11/14 0452) BP: (114-131)/(54-69) 114/57 (11/14 0452) SpO2:  [86 %-98 %] 98 % (11/14 0452)  Intake/Output from previous day:  Intake/Output Summary (Last 24 hours) at 11/26/2022 0755 Last data filed at 11/26/2022 0600 Gross per 24 hour  Intake 1260 ml  Output 900 ml  Net 360 ml    Intake/Output this shift: No intake/output data recorded.  Labs: Recent Labs    11/24/22 0337 11/25/22 0339  HGB 11.2* 11.1*   Recent Labs    11/24/22 0337 11/25/22 0339  WBC 6.1 7.7  RBC 3.61* 3.60*  HCT 35.4* 35.4*  PLT 141* 145*   Recent Labs    11/24/22 0337  NA 138  K 3.9  CL 104  CO2 25  BUN 16  CREATININE 0.96  GLUCOSE 142*  CALCIUM 7.9*   No results for input(s): "LABPT", "INR" in the last 72 hours.  Exam: General - Patient is Alert and Oriented Extremity - Neurologically intact Neurovascular intact Sensation intact distally Dorsiflexion/Plantar flexion intact Dressing/Incision - clean, dry, no drainage Motor Function - intact, moving foot and toes well on exam.  Past Medical History:  Diagnosis Date   Acute bronchitis    Allergic rhinosinusitis    Anxiety and depression    Basal cell carcinoma    Nose   Candida infection of flexural skin    Carpal tunnel syndrome    Complication of anesthesia    hard time waking up , And BP went upwith a finger surgery,   Dyslipidemia    Dyspnea    GERD (gastroesophageal reflux disease)     Laryngitis    LBBB (left bundle branch block)    OAB (overactive bladder)    Osteoarthritis of right knee    Pedal edema    PONV (postoperative nausea and vomiting)    Pre-diabetes    Vitamin B 12 deficiency    Vitamin D deficiency     Assessment/Plan: 3 Days Post-Op Procedure(s) (LRB): TOTAL KNEE ARTHROPLASTY (Left) Principal Problem:   Osteoarthritis of knee Active Problems:   Primary osteoarthritis of left knee  Estimated body mass index is 40.54 kg/m as calculated from the following:   Height as of this encounter: 5' (1.524 m).   Weight as of this encounter: 94.2 kg.  DVT Prophylaxis - Aspirin Weight-bearing as tolerated.  Will have additional session of physical therapy this AM and then discharge home. Scheduled OPPT at [facility]. F/u in clinic in 2 weeks.  The PDMP database was reviewed today prior to any opioid medications being prescribed to this patient.  R. Arcola Jansky, PA-C Orthopedic Surgery 11/26/2022, 7:55 AM

## 2022-11-26 NOTE — Plan of Care (Signed)
  Problem: Education: Goal: Knowledge of General Education information will improve Description: Including pain rating scale, medication(s)/side effects and non-pharmacologic comfort measures Outcome: Progressing   Problem: Health Behavior/Discharge Planning: Goal: Ability to manage health-related needs will improve Outcome: Progressing   Problem: Clinical Measurements: Goal: Ability to maintain clinical measurements within normal limits will improve Outcome: Progressing Goal: Will remain free from infection Outcome: Progressing Goal: Diagnostic test results will improve Outcome: Progressing Goal: Respiratory complications will improve Outcome: Progressing Goal: Cardiovascular complication will be avoided Outcome: Progressing   Problem: Activity: Goal: Risk for activity intolerance will decrease Outcome: Progressing   Problem: Nutrition: Goal: Adequate nutrition will be maintained Outcome: Progressing   Problem: Coping: Goal: Level of anxiety will decrease Outcome: Progressing   Problem: Elimination: Goal: Will not experience complications related to bowel motility Outcome: Progressing Goal: Will not experience complications related to urinary retention Outcome: Progressing   Problem: Pain Management: Goal: General experience of comfort will improve Outcome: Progressing   Problem: Safety: Goal: Ability to remain free from injury will improve Outcome: Progressing   Problem: Skin Integrity: Goal: Risk for impaired skin integrity will decrease Outcome: Progressing   Problem: Education: Goal: Knowledge of the prescribed therapeutic regimen will improve Outcome: Progressing Goal: Individualized Educational Video(s) Outcome: Progressing   Problem: Activity: Goal: Ability to avoid complications of mobility impairment will improve Outcome: Progressing Goal: Range of joint motion will improve Outcome: Progressing   Problem: Clinical Measurements: Goal:  Postoperative complications will be avoided or minimized Outcome: Progressing   Problem: Pain Management: Goal: Pain level will decrease with appropriate interventions Outcome: Progressing   Problem: Skin Integrity: Goal: Will show signs of wound healing Outcome: Progressing   Problem: Education: Goal: Knowledge of General Education information will improve Description: Including pain rating scale, medication(s)/side effects and non-pharmacologic comfort measures Outcome: Progressing   Problem: Health Behavior/Discharge Planning: Goal: Ability to manage health-related needs will improve Outcome: Progressing   Problem: Clinical Measurements: Goal: Ability to maintain clinical measurements within normal limits will improve Outcome: Progressing Goal: Will remain free from infection Outcome: Progressing Goal: Diagnostic test results will improve Outcome: Progressing Goal: Respiratory complications will improve Outcome: Progressing Goal: Cardiovascular complication will be avoided Outcome: Progressing   Problem: Activity: Goal: Risk for activity intolerance will decrease Outcome: Progressing   Problem: Nutrition: Goal: Adequate nutrition will be maintained Outcome: Progressing   Problem: Coping: Goal: Level of anxiety will decrease Outcome: Progressing   Problem: Elimination: Goal: Will not experience complications related to bowel motility Outcome: Progressing Goal: Will not experience complications related to urinary retention Outcome: Progressing   Problem: Pain Management: Goal: General experience of comfort will improve Outcome: Progressing   Problem: Safety: Goal: Ability to remain free from injury will improve Outcome: Progressing   Problem: Skin Integrity: Goal: Risk for impaired skin integrity will decrease Outcome: Progressing   Problem: Education: Goal: Knowledge of the prescribed therapeutic regimen will improve Outcome: Progressing Goal:  Individualized Educational Video(s) Outcome: Progressing   Problem: Activity: Goal: Ability to avoid complications of mobility impairment will improve Outcome: Progressing Goal: Range of joint motion will improve Outcome: Progressing   Problem: Clinical Measurements: Goal: Postoperative complications will be avoided or minimized Outcome: Progressing   Problem: Pain Management: Goal: Pain level will decrease with appropriate interventions Outcome: Progressing   Problem: Skin Integrity: Goal: Will show signs of wound healing Outcome: Progressing

## 2022-11-26 NOTE — Progress Notes (Signed)
Patient failed PT session today. Session planned for am and patient hopeful for discharge. Haydee Salter, RN 11/26/22 6:05 PM

## 2022-11-26 NOTE — Progress Notes (Signed)
Orthopedic Tech Progress Note Patient Details:  Janet Diaz Nov 03, 1945 [id number]  CPM Left Knee CPM Left Knee: Off Left Knee Flexion (Degrees): 40 Left Knee Extension (Degrees): 10  Post Interventions Patient Tolerated: Well Instructions Provided: Adjustment of device, Care of device  [name] 11/26/2022, 7:44 PM

## 2022-11-26 NOTE — Progress Notes (Signed)
Orthopedic Tech Progress Note Patient Details:  Janet Diaz May 04, 1945 [id number]  CPM Left Knee CPM Left Knee: On Left Knee Flexion (Degrees): 40 Left Knee Extension (Degrees): 10  Post Interventions Patient Tolerated: Well Instructions Provided: Adjustment of device, Care of device  [name] 11/26/2022, 5:50 PM

## 2022-11-27 ENCOUNTER — Observation Stay (HOSPITAL_COMMUNITY): Payer: Medicare Other

## 2022-11-27 DIAGNOSIS — M1712 Unilateral primary osteoarthritis, left knee: Secondary | ICD-10-CM | POA: Diagnosis not present

## 2022-11-27 NOTE — Progress Notes (Signed)
Physical Therapy Treatment Patient Details Name: Janet Diaz MRN: [id number] DOB: 1945/08/07 Today's Date: 11/27/2022   History of Present Illness 77 yo female presents to therapy s/p L TKA on 11/23/2022 due to failure of conservative measures. 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    PT Comments  Pt received up in chair, L LE remains red, edematous, and warm despite ice and TED hose, - Homans sign. Session focused on gait training with RW 125' on RA with increased fatigue and c/o congestion. SpO2 87% upon returning to room, 96% after 2 minute seated rest break. Pt states she is continuing to cough up red, brown, and yellow sputum, MD and Nursing notified. Pt now febrile with temp of 100.1, chest x-ray ordered. Functionally pt appears ready to d/c, will await medical clearance.    If plan is discharge home, recommend the following: A little help with walking and/or transfers;A little help with bathing/dressing/bathroom;Assistance with cooking/housework;Assist for transportation;Help with stairs or ramp for entrance   Can travel by private vehicle        Equipment Recommendations  Rolling walker (2 wheels)    Recommendations for Other Services       Precautions / Restrictions Precautions Precautions: Knee;Fall Precaution Booklet Issued: Yes (comment) Precaution Comments: Reviewed packet with pt and daughter with good understanding Restrictions Weight Bearing Restrictions: Yes LLE Weight Bearing: Weight bearing as tolerated     Mobility  Bed Mobility Overal bed mobility: Needs Assistance Bed Mobility: Supine to Sit     Supine to sit: HOB elevated, Used rails, Supervision     General bed mobility comments: NT, in chair pre/post session    Transfers Overall transfer level: Needs assistance Equipment used: Rolling walker (2 wheels) Transfers: Sit to/from Stand Sit to Stand: Supervision            General transfer comment: Improved ability to complete transfers with Supervision    Ambulation/Gait Ambulation/Gait assistance: Supervision Gait Distance (Feet):  (125) Assistive device: Rolling walker (2 wheels) Gait Pattern/deviations: Step-to pattern, Decreased stance time - left Gait velocity: decreased     General Gait Details: Cues for L knee flexion and heel strike during gait. No LOB or buckling   Stairs Stairs:  (Pt has a ramp at home)           Wheelchair Mobility     Tilt Bed    Modified Rankin (Stroke Patients Only)       Balance Overall balance assessment: Needs assistance, History of Falls Sitting-balance support: Feet supported Sitting balance-Leahy Scale: Good     Standing balance support: Bilateral upper extremity supported, During functional activity, Reliant on assistive device for balance Standing balance-Leahy Scale: Fair Standing balance comment: No LOB while ambulating with RW in hallway                            Cognition Arousal: Alert Behavior During Therapy: WFL for tasks assessed/performed Overall Cognitive Status: Within Functional Limits for tasks assessed                                          Exercises Total Joint Exercises Ankle Circles/Pumps: AROM, Both, 10 reps Quad Sets: AROM, Both, 10 reps Heel Slides: AAROM, Left, 5 reps, Supine Straight Leg Raises: AAROM, Left, 5 reps,  Supine Knee Flexion: AAROM, Left, Seated Goniometric ROM: 4-85    General Comments General comments (skin integrity, edema, etc.): L LE erythema remains present with edema and warm to the touch. Nursing and PA again notified of pt couging up brown, yellow, and red sputum.      Pertinent Vitals/Pain Pain Assessment Pain Assessment: 0-10 Pain Score: 5  Pain Location: L knee and LE Pain Descriptors / Indicators: Constant, Discomfort, Operative site guarding Pain Intervention(s): Monitored during session     Home Living                          Prior Function            PT Goals (current goals can now be found in the care plan section) Acute Rehab PT Goals Patient Stated Goal: to be able to do what I was doing before and take trips with friends Progress towards PT goals: Progressing toward goals    Frequency    7X/week      PT Plan      Co-evaluation              AM-PAC PT "6 Clicks" Mobility   Outcome Measure  Help needed turning from your back to your side while in a flat bed without using bedrails?: A Little Help needed moving from lying on your back to sitting on the side of a flat bed without using bedrails?: A Little Help needed moving to and from a bed to a chair (including a wheelchair)?: A Little Help needed standing up from a chair using your arms (e.g., wheelchair or bedside chair)?: A Little Help needed to walk in hospital room?: A Little Help needed climbing 3-5 steps with a railing? : A Lot 6 Click Score: 17    End of Session Equipment Utilized During Treatment: Gait belt Activity Tolerance: Patient tolerated treatment well Patient left: in chair;with call bell/phone within reach;with chair alarm set;with family/visitor present Nurse Communication: Mobility status;Other (comment) (Chest congestion with red, brown, and) PT Visit Diagnosis: Unsteadiness on feet (R26.81);Other abnormalities of gait and mobility (R26.89);Repeated falls (R29.6);Muscle weakness (generalized) (M62.81);Difficulty in walking, not elsewhere classified (R26.2);Pain Pain - Right/Left: Left Pain - part of body: Knee;Leg     Time: 1300-1331 PT Time Calculation (min) (ACUTE ONLY): 31 min  Charges:    $Gait Training: 8-22 mins $Therapeutic Exercise: 8-22 mins $Therapeutic Activity: 8-22 mins PT General Charges $$ ACUTE PT VISIT: 1 Visit                    Janet Diaz, PTA  Janet Diaz 11/27/2022, 2:38 PM

## 2022-11-27 NOTE — Plan of Care (Signed)
Problem: Education: Goal: Knowledge of General Education information will improve Description: Including pain rating scale, medication(s)/side effects and non-pharmacologic comfort measures Outcome: Progressing   Problem: Clinical Measurements: Goal: Ability to maintain clinical measurements within normal limits will improve Outcome: Progressing   Problem: Activity: Goal: Risk for activity intolerance will decrease Outcome: Progressing   Problem: Coping: Goal: Level of anxiety will decrease Outcome: Progressing   Problem: Pain Management: Goal: General experience of comfort will improve Outcome: Progressing   Problem: Safety: Goal: Ability to remain free from injury will improve Outcome: Progressing   Haydee Salter, RN 11/27/22 10:04 AM

## 2022-11-27 NOTE — Progress Notes (Signed)
   Subjective: 4 Days Post-Op Procedure(s) (LRB): TOTAL KNEE ARTHROPLASTY (Left) Patient reports pain as mild.   Patient seen in rounds for Dr. Lequita Halt. Patient is well, and has had no acute complaints or problems Plan is to go Home after hospital stay.  Objective: Vital signs in last 24 hours: Temp:  [99 F (37.2 C)-99.8 F (37.7 C)] 99.1 F (37.3 C) (11/15 0419) Pulse Rate:  [83-91] 88 (11/15 0419) Resp:  [16-18] 16 (11/15 0419) BP: (111-146)/(61-70) 146/70 (11/15 0419) SpO2:  [99 %] 99 % (11/15 0419)  Intake/Output from previous day:  Intake/Output Summary (Last 24 hours) at 11/27/2022 0844 Last data filed at 11/26/2022 1300 Gross per 24 hour  Intake --  Output 1 ml  Net -1 ml    Intake/Output this shift: No intake/output data recorded.  Labs: Recent Labs    11/25/22 0339  HGB 11.1*   Recent Labs    11/25/22 0339  WBC 7.7  RBC 3.60*  HCT 35.4*  PLT 145*   No results for input(s): "NA", "K", "CL", "CO2", "BUN", "CREATININE", "GLUCOSE", "CALCIUM" in the last 72 hours. No results for input(s): "LABPT", "INR" in the last 72 hours.  Exam: General - Patient is Alert and Oriented Extremity - Neurologically intact Neurovascular intact Sensation intact distally Dorsiflexion/Plantar flexion intact Dressing/Incision - clean, dry, no drainage Motor Function - intact, moving foot and toes well on exam.   Past Medical History:  Diagnosis Date   Acute bronchitis    Allergic rhinosinusitis    Anxiety and depression    Basal cell carcinoma    Nose   Candida infection of flexural skin    Carpal tunnel syndrome    Complication of anesthesia    hard time waking up , And BP went upwith a finger surgery,   Dyslipidemia    Dyspnea    GERD (gastroesophageal reflux disease)    Laryngitis    LBBB (left bundle branch block)    OAB (overactive bladder)    Osteoarthritis of right knee    Pedal edema    PONV (postoperative nausea and vomiting)    Pre-diabetes     Vitamin B 12 deficiency    Vitamin D deficiency     Assessment/Plan: 4 Days Post-Op Procedure(s) (LRB): TOTAL KNEE ARTHROPLASTY (Left) Principal Problem:   Osteoarthritis of knee Active Problems:   Primary osteoarthritis of left knee  Estimated body mass index is 40.54 kg/m as calculated from the following:   Height as of this encounter: 5' (1.524 m).   Weight as of this encounter: 94.2 kg. Up with therapy  DVT Prophylaxis - Aspirin Weight-bearing as tolerated  The left knee has no erythema today, there was also no erythema yesterday when I checked her at lunchtime after first session of therapy. There is no overt heat to the knee, this is normal postoperative change.   Discharge to home after first session of therapy today. We are working on getting her PT scheduled at [facility] next week.  Arther Abbott, PA-C Orthopedic Surgery [phone] 11/27/2022, 8:44 AM

## 2022-11-28 DIAGNOSIS — M1712 Unilateral primary osteoarthritis, left knee: Secondary | ICD-10-CM | POA: Diagnosis not present

## 2022-11-28 NOTE — Progress Notes (Signed)
Physical Therapy Treatment Patient Details Name: Janet Diaz MRN: [id number] DOB: Aug 02, 1945 Today's Date: 11/28/2022   History of Present Illness 77 yo female presents to therapy s/p L TKA on 11/23/2022 due to failure of conservative measures. 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    PT Comments  Pt tolerated session well today. Still have some pain ans swelling, educated about importance of moving the knee throughout the day to decrease swelling as well as elevation and ice. Reviewed HEP, walking, stairs, and toileting today. That session was a lot , patient moves slow, and was a bit exhausted at the end. Ready for DC with family to be able to assist pt form PT standpoint.    If plan is discharge home, recommend the following: A little help with walking and/or transfers;A little help with bathing/dressing/bathroom;Assistance with cooking/housework;Assist for transportation;Help with stairs or ramp for entrance   Can travel by private vehicle        Equipment Recommendations  Rolling walker (2 wheels)    Recommendations for Other Services       Precautions / Restrictions Precautions Precautions: Knee;Fall Restrictions Weight Bearing Restrictions: Yes LLE Weight Bearing: Weight bearing as tolerated Other Position/Activity Restrictions: WBAT L LE     Mobility  Bed Mobility Overal bed mobility: Needs Assistance Bed Mobility: Supine to Sit     Supine to sit: HOB elevated, Used rails, Min assist     General bed mobility comments: Mn assist with LLE  to help get off bed. and needed HOB elevated and use of rail. She will need assistance fromf amily at home    Transfers Overall transfer level: Needs assistance Equipment used: Rolling walker (2 wheels) Transfers: Sit to/from Stand Sit to Stand: Contact guard assist (slow and needed bed elvated, however off toliet she used grab bar and able to  complete with no assistance)                Ambulation/Gait Ambulation/Gait assistance: Contact guard assist Gait Distance (Feet): 60 Feet (very slow)   Gait Pattern/deviations: Step-to pattern, Decreased stance time - left Gait velocity: decreased     General Gait Details: Cues for L knee flexion and heel strike during gait. No LOB or buckling   Stairs Stairs: Yes Stairs assistance: Contact guard assist Stair Management: No rails Number of Stairs: 1 General stair comments: complete up and over single step 2x with forward approach and cues for up with good and down with the bad.   Wheelchair Mobility     Tilt Bed    Modified Rankin (Stroke Patients Only)       Balance Overall balance assessment: Needs assistance, History of Falls Sitting-balance support: Feet supported Sitting balance-Leahy Scale: Good     Standing balance support: Bilateral upper extremity supported, During functional activity, Reliant on assistive device for balance Standing balance-Leahy Scale: Fair Standing balance comment: No LOB while ambulating with RW in hallway                            Cognition Arousal: Alert Behavior During Therapy: [facility] for tasks assessed/performed Overall Cognitive Status: Within Functional Limits for tasks assessed  Exercises Total Joint Exercises Ankle Circles/Pumps: AROM, Both, 10 reps Quad Sets: AROM, Both, 10 reps Heel Slides: AAROM, Left, Supine, 10 reps Straight Leg Raises: AAROM, Left, Supine, 10 reps Knee Flexion: AAROM, Left, Supine (slow gradual knee flexion stetch supine over bolster prior to heel slides assisted in flexion Knee flexion imporved sitting EOB as well) Goniometric ROM: 0-75 sitting EOB at 75 with some force    General Comments        Pertinent Vitals/Pain Pain Assessment Pain Score: 8  Pain Location: L knee and LE after our session Pain Descriptors /  Indicators: Constant, Discomfort, Operative site guarding Pain Intervention(s): Limited activity within patient's tolerance, Monitored during session, Ice applied    Home Living                          Prior Function            PT Goals (current goals can now be found in the care plan section) Acute Rehab PT Goals Patient Stated Goal: to be able to do what I was doing before and take trips with friends PT Goal Formulation: With patient Time For Goal Achievement: 12/07/22 Potential to Achieve Goals: Good Progress towards PT goals: Progressing toward goals    Frequency    7X/week      PT Plan      Co-evaluation              AM-PAC PT "6 Clicks" Mobility   Outcome Measure  Help needed turning from your back to your side while in a flat bed without using bedrails?: A Little Help needed moving from lying on your back to sitting on the side of a flat bed without using bedrails?: A Little Help needed moving to and from a bed to a chair (including a wheelchair)?: A Little Help needed standing up from a chair using your arms (e.g., wheelchair or bedside chair)?: A Little Help needed to walk in hospital room?: A Little Help needed climbing 3-5 steps with a railing? : A Little 6 Click Score: 18    End of Session Equipment Utilized During Treatment: Gait belt Activity Tolerance: Patient tolerated treatment well Patient left: in chair;with call bell/phone within reach;with chair alarm set Nurse Communication: Mobility status;Other (comment) PT Visit Diagnosis: Unsteadiness on feet (R26.81);Other abnormalities of gait and mobility (R26.89);Repeated falls (R29.6);Muscle weakness (generalized) (M62.81);Difficulty in walking, not elsewhere classified (R26.2);Pain Pain - Right/Left: Left Pain - part of body: Knee;Leg     Time: 0930-1030 PT Time Calculation (min) (ACUTE ONLY): 60 min  Charges:    $Gait Training: 23-37 mins $Therapeutic Exercise: 8-22  mins $Therapeutic Activity: 8-22 mins PT General Charges $$ ACUTE PT VISIT: 1 Visit                     Devesh Monforte, PT, MPT Acute Rehabilitation Services Office: [phone] If a weekend: secure chat groups: [facility] PT, [facility] OT, [facility] SLP 11/28/2022    Cornelius Marullo 11/28/2022, 12:43 PM

## 2022-11-28 NOTE — Progress Notes (Signed)
   Subjective: 5 Days Post-Op Procedure(s) (LRB): TOTAL KNEE ARTHROPLASTY (Left) Patient reports pain as mild.   Patient seen in rounds for Dr. Lequita Halt. Patient is well, and has had no acute complaints or problems Plan is to go Home after hospital stay.  Objective: Vital signs in last 24 hours: Temp:  [98.2 F (36.8 C)-100.1 F (37.8 C)] 98.2 F (36.8 C) (11/16 0611) Pulse Rate:  [71-94] 71 (11/16 0611) Resp:  [16-17] 16 (11/16 0611) BP: (134-139)/(62-65) 139/62 (11/16 0611) SpO2:  [93 %-96 %] 93 % (11/16 0611)  Intake/Output from previous day:  Intake/Output Summary (Last 24 hours) at 11/28/2022 0918 Last data filed at 11/27/2022 1858 Gross per 24 hour  Intake 590 ml  Output --  Net 590 ml    Intake/Output this shift: No intake/output data recorded.  Labs: No results for input(s): "HGB" in the last 72 hours.  No results for input(s): "WBC", "RBC", "HCT", "PLT" in the last 72 hours.  No results for input(s): "NA", "K", "CL", "CO2", "BUN", "CREATININE", "GLUCOSE", "CALCIUM" in the last 72 hours. No results for input(s): "LABPT", "INR" in the last 72 hours.  Exam: General - Patient is Alert and Oriented Extremity - Neurologically intact Neurovascular intact Sensation intact distally Dorsiflexion/Plantar flexion intact Dressing/Incision - clean, dry, no drainage Motor Function - intact, moving foot and toes well on exam.   Past Medical History:  Diagnosis Date   Acute bronchitis    Allergic rhinosinusitis    Anxiety and depression    Basal cell carcinoma    Nose   Candida infection of flexural skin    Carpal tunnel syndrome    Complication of anesthesia    hard time waking up , And BP went upwith a finger surgery,   Dyslipidemia    Dyspnea    GERD (gastroesophageal reflux disease)    Laryngitis    LBBB (left bundle branch block)    OAB (overactive bladder)    Osteoarthritis of right knee    Pedal edema    PONV (postoperative nausea and vomiting)     Pre-diabetes    Vitamin B 12 deficiency    Vitamin D deficiency     Assessment/Plan: 5 Days Post-Op Procedure(s) (LRB): TOTAL KNEE ARTHROPLASTY (Left) Principal Problem:   Osteoarthritis of knee Active Problems:   Primary osteoarthritis of left knee  Estimated body mass index is 40.54 kg/m as calculated from the following:   Height as of this encounter: 5' (1.524 m).   Weight as of this encounter: 94.2 kg. Up with therapy  DVT Prophylaxis - Aspirin Weight-bearing as tolerated Discharge to home after therapy. We are working on getting her PT scheduled at [facility] next week.  Netta Cedars, MD Orthopaedic Surgery [facility]

## 2022-12-02 NOTE — Discharge Summary (Signed)
Patient ID: Janet Diaz MRN: [id number] DOB/AGE: 79-Oct-1947 79 y.o.  Admit date: 11/23/2022 Discharge date: 11/28/2022  Admission Diagnoses:  Principal Problem:   Osteoarthritis of knee Active Problems:   Primary osteoarthritis of left knee   Discharge Diagnoses:  Same  Past Medical History:  Diagnosis Date   Acute bronchitis    Allergic rhinosinusitis    Anxiety and depression    Basal cell carcinoma    Nose   Candida infection of flexural skin    Carpal tunnel syndrome    Complication of anesthesia    hard time waking up , And BP went upwith a finger surgery,   Dyslipidemia    Dyspnea    GERD (gastroesophageal reflux disease)    Laryngitis    LBBB (left bundle branch block)    OAB (overactive bladder)    Osteoarthritis of right knee    Pedal edema    PONV (postoperative nausea and vomiting)    Pre-diabetes    Vitamin B 12 deficiency    Vitamin D deficiency     Surgeries: Procedure(s): TOTAL KNEE ARTHROPLASTY on 11/23/2022   Consultants:   Discharged Condition: Improved  Hospital Course: Janet Diaz is an 77 y.o. female who was admitted 11/23/2022 for operative treatment ofOsteoarthritis of knee. Patient has severe unremitting pain that affects sleep, daily activities, and work/hobbies. After pre-op clearance the patient was taken to the operating room on 11/23/2022 and underwent  Procedure(s): TOTAL KNEE ARTHROPLASTY.    Patient was given perioperative antibiotics:  Anti-infectives (From admission, onward)    Start     Dose/Rate Route Frequency Ordered Stop   11/23/22 1730  ceFAZolin (ANCEF) IVPB 2g/100 mL premix        2 g 200 mL/hr over 30 Minutes Intravenous Every 6 hours 11/23/22 1509 11/24/22 0000   11/23/22 1030  ceFAZolin (ANCEF) IVPB 2g/100 mL premix        2 g 200 mL/hr over 30 Minutes Intravenous On call to O.R. 11/23/22 1027 11/23/22 1132        Patient was given sequential compression devices, early ambulation, and chemoprophylaxis  to prevent DVT.  Patient benefited maximally from hospital stay and there were no complications.    Recent vital signs: No data found.   Recent laboratory studies: No results for input(s): "WBC", "HGB", "HCT", "PLT", "NA", "K", "CL", "CO2", "BUN", "CREATININE", "GLUCOSE", "INR", "CALCIUM" in the last 72 hours.  Invalid input(s): "PT", "2"   Discharge Medications:   Allergies as of 11/28/2022       Reactions   Omeprazole Itching   Prednisone    confusion   Tape    unknown   Ampicillin Rash           Medication List     STOP taking these medications    aspirin EC 81 MG tablet Replaced by: aspirin 81 MG chewable tablet       TAKE these medications    albuterol 108 (90 Base) MCG/ACT inhaler Commonly known as: VENTOLIN HFA Inhale 2 puffs into the lungs every 6 (six) hours as needed for shortness of breath or wheezing.   aspirin 81 MG chewable tablet Chew 1 tablet (81 mg total) by mouth 2 (two) times daily for 19 days. Then resume an 81 mg aspirin once a day. Replaces: aspirin EC 81 MG tablet   citalopram 40 MG tablet Commonly known as: CELEXA Take 20 mg by mouth at bedtime.   Clobetasol Propionate E 0.05 % emollient cream Generic drug: Clobetasol Prop  Emollient Base Apply 1 Application topically daily. What changed:  when to take this reasons to take this   ergocalciferol 1.25 MG (50000 UT) capsule Commonly known as: VITAMIN D2 Take 50,000 Units by mouth every Wednesday. AT BEDTIME   estradiol 0.1 MG/GM vaginal cream Commonly known as: ESTRACE Place 0.5 g vaginally 2 (two) times a week. Place 0.5g nightly for two weeks then twice a week after What changed:  when to take this reasons to take this additional instructions   fluticasone 50 MCG/ACT nasal spray Commonly known as: FLONASE Place 1 spray into both nostrils 2 (two) times daily as needed for allergies.   furosemide 20 MG tablet Commonly known as: LASIX Take 20 mg by mouth daily as needed  for edema.   loratadine 10 MG tablet Commonly known as: CLARITIN Take 10 mg by mouth daily.   methocarbamol 500 MG tablet Commonly known as: ROBAXIN Take 1 tablet (500 mg total) by mouth every 6 (six) hours as needed for muscle spasms.   montelukast 10 MG tablet Commonly known as: SINGULAIR Take 10 mg by mouth at bedtime.   nystatin powder Commonly known as: MYCOSTATIN/NYSTOP Apply 1 Application topically 3 (three) times daily. What changed:  when to take this reasons to take this   ondansetron 4 MG tablet Commonly known as: ZOFRAN Take 1 tablet (4 mg total) by mouth every 6 (six) hours as needed for nausea.   oxyCODONE 5 MG immediate release tablet Commonly known as: Oxy IR/ROXICODONE Take 1-2 tablets (5-10 mg total) by mouth every 6 (six) hours as needed for severe pain (pain score 7-10).   pantoprazole 40 MG tablet Commonly known as: PROTONIX Take 40 mg by mouth daily before breakfast.   potassium chloride 10 MEQ CR capsule Commonly known as: MICRO-K Take 10 mEq by mouth daily as needed (when taking lasix).               Discharge Care Instructions  (From admission, onward)           Start     Ordered   11/26/22 0000  Weight bearing as tolerated        11/26/22 0758   11/26/22 0000  Change dressing       Comments: You may remove the bulky bandage (ACE wrap and gauze) two days after surgery. You will have an adhesive waterproof bandage underneath. Leave this in place until your first follow-up appointment.   11/26/22 0758            Diagnostic Studies: DG CHEST PORT 1 VIEW  Result Date: 11/27/2022 CLINICAL DATA:  Bloody sputum EXAM: PORTABLE CHEST 1 VIEW COMPARISON:  06/02/2022 FINDINGS: Single frontal view of the chest demonstrates an unremarkable cardiac silhouette. Lung volumes are diminished, with crowding of the pulmonary vasculature. No airspace disease, effusion, or pneumothorax. No acute bony abnormalities. IMPRESSION: 1. Low lung volumes.   No acute airspace disease. Electronically Signed   By: Sharlet Salina M.D.   On: 11/27/2022 18:33    Disposition: Discharge disposition: 01-Home or Self Care       Discharge Instructions     Call MD / Call 911   Complete by: As directed    If you experience chest pain or shortness of breath, CALL 911 and be transported to the hospital emergency room.  If you develope a fever above 101 F, pus (white drainage) or increased drainage or redness at the wound, or calf pain, call your surgeon's office.   Change dressing  Complete by: As directed    You may remove the bulky bandage (ACE wrap and gauze) two days after surgery. You will have an adhesive waterproof bandage underneath. Leave this in place until your first follow-up appointment.   Constipation Prevention   Complete by: As directed    Drink plenty of fluids.  Prune juice may be helpful.  You may use a stool softener, such as Colace (over the counter) 100 mg twice a day.  Use MiraLax (over the counter) for constipation as needed.   Diet - low sodium heart healthy   Complete by: As directed    Do not put a pillow under the knee. Place it under the heel.   Complete by: As directed    Driving restrictions   Complete by: As directed    No driving for two weeks   Post-operative opioid taper instructions:   Complete by: As directed    POST-OPERATIVE OPIOID TAPER INSTRUCTIONS: It is important to wean off of your opioid medication as soon as possible. If you do not need pain medication after your surgery it is ok to stop day one. Opioids include: Codeine, Hydrocodone(Norco, Vicodin), Oxycodone(Percocet, oxycontin) and hydromorphone amongst others.  Long term and even short term use of opiods can cause: Increased pain response Dependence Constipation Depression Respiratory depression And more.  Withdrawal symptoms can include Flu like symptoms Nausea, vomiting And more Techniques to manage these symptoms Hydrate well Eat  regular healthy meals Stay active Use relaxation techniques(deep breathing, meditating, yoga) Do Not substitute Alcohol to help with tapering If you have been on opioids for less than two weeks and do not have pain than it is ok to stop all together.  Plan to wean off of opioids This plan should start within one week post op of your joint replacement. Maintain the same interval or time between taking each dose and first decrease the dose.  Cut the total daily intake of opioids by one tablet each day Next start to increase the time between doses. The last dose that should be eliminated is the evening dose.      TED hose   Complete by: As directed    Use stockings (TED hose) for three weeks on both leg(s).  You may remove them at night for sleeping.   Weight bearing as tolerated   Complete by: As directed         Follow-up Information     Aluisio, Homero Fellers, MD Follow up in 2 week(s).   Specialty: Orthopedic Surgery Contact information: [address] [place] [address] [place] [address] [phone]                  Signed: Arther Abbott 12/02/2022, 2:07 PM

## 2023-01-04 ENCOUNTER — Ambulatory Visit (HOSPITAL_COMMUNITY)
Admission: RE | Admit: 2023-01-04 | Discharge: 2023-01-04 | Disposition: A | Discharge status: Home / Self Care | Payer: Medicare Other | Source: Ambulatory Visit | Attending: Surgery | Admitting: Surgery

## 2023-01-04 ENCOUNTER — Other Ambulatory Visit (HOSPITAL_COMMUNITY): Payer: Self-pay | Admitting: Family Medicine

## 2023-01-04 DIAGNOSIS — R52 Pain, unspecified: Secondary | ICD-10-CM

## 2023-09-14 NOTE — Progress Notes (Signed)
 ------------------------------------------------------------------------------- Summary: CF #19 and EXT #20 -------------------------------------------------------------------------------  [facility] 09/14/23  Dental procedures in this visit  . I7606 - RESIN-BASED COMPOSITE - THREE SURFACES, POSTERIOR 19 DBL(V) (Completed)    Service provider: Selinda Benne, DDS    Billing provider: Selinda Benne, DDS  . I2789 - EXTRACTION, ERUPTED TOOTH REQUIRING REMOVAL OF BONE AND/OR SECTIONING OF TOOTH 20 (Completed)    Service provider: Selinda Benne, DDS    Billing provider: Selinda Benne, DDS   SUBJECTIVE Chief complaint: Patient reports with caries lesion(s) in need of restorative treatment.  Health History? Medical History[1] Medications Ordered Prior to Encounter[2] Allergies[3]  Problem List[4]  Surgical History[5] Family History[6] Social History[7]  Medical Risk Assessment ASA GRADE: ASA 1 - Normal health patient  Medical history was reviewed and updated. No contraindication to care.  Pain Level Tooth/teeth to be treated testing asymptomatic. Pain score currently: 0 (0-10 scale)  Patient's expectations: Patient understands the procedure and expects a functional and esthetically pleasing restoration.  OBJECTIVE Vital Signs BP Readings from Last 1 Encounters:  09/14/23 137/66  Pulse: 65  Pre-procedure Examination Tooth/teeth #19 being treated for the following condition(s): Secondary caries Root surface caries  Pre-procedure Radiograph/Intra-oral Image Date taken: 08/02/23  Radiograph interpretation: caries on the distal of 19, previously treatment plan for exploration and possible crown.  Procedure ANESTHESIA Topical anesthetic: Benzocaine 20% Lidocaine 2% epi: 1:100K x Carpules: 1 carpule;  ISOLATION Cotton rolls and Dry angle isolation PREPARATION Caries excavated and Existing restoration partially removed BASE/LINER N/A MATRIX No matrix  used RESTORATIVE MATERIALS Bonding agent: NA, selective etching Composite resin: Fuji was utilized, shade: A2 Sealant: Clinpro Amalgam: NA  FINISHING/POLISHING Occlusion checked and adjusted Restoration finished and polished using diamonds  Post-procedure examination: Uneventful procedure, goal with procedure achieved.  Additional notes: Patient was treatment planned for exploration and crown, after exploration Dr. Benne with patients consent removed the caries and placed a Fuji filling instead, he explained to her that he thinks that it will hold but he did not have 100% certainty. She agreed to treatment.  ASSESSMENT Diagnosis: Caries, restorable, noted gutta percha exposure, poss risk for endo reinfection but was able to excavate and access unaffected tooth structure. Rec monitoring for symptoms and contact office if pt experiences discomfort Prognosis: Fair   PLAN Post-operative Instructions Patient informed about possible short-term sensitivity to cold.  Provider should be contacted in the event of severe sensitivity or any sensitivity to heat, and/or pain on biting. Patient advised to be careful with soft tissues on the anesthetized side until the anesthesia wears off.  Fuji II RMGI used to restore #19 after excavating, etched 5s, cotton roll isolation.   Patient Education Patient educated on proper oral hygiene techniques and the importance of regular dental check-ups in order to maximize the longevity of the restoration.  Referrals: None  Next visit: Dr. appointment: patient will return for a crown on 5 then 12.  Treatment Providers Suan Leos, DA II Ronal Ronnald Greathouse, DMD 2 Selinda Benne, DDS [facility] [address] [place], [place]  [address] [phone]    Subjective: Patient presents with retained root tip on 20, pain on pressure. Patient reports  no change to condition since last visit.   Objective:  Radiographs taken: 1 PAs  of area: 20    All Radiographs Entry: Radiographs show that the root tip has been fully removed.   Assessment: Pt presents to clinic for surgical singular of tooth number(s): 20 due to limited PDL  and frequent chipping of root during removal.   Discussed with patient risks and precautions of procedure. Necessary consent were obtained from patient.  RISKS & LIABILITY GIVEN: Discussed with patient potential risk of procedural complication which may include NA  Paused to confirm correct patient, correct teeth for treatment, and confirmed diagnosis for treatment.  Consent obtained? Yes  Topical (Benzocaine 20%) placed.  ANESTHETIC 1:Septocaine (Articaine hydrochloride 4% w/ epi 1:100K) x Carpules: 1 carpule;  ANESTHETIC 2:No Additional Anesthetic required  Profound anesthesia achieved. Use of periosteal, luxators and elevators to luxate tooth. Use of forcep to extract tooth. Curetted and Irrigation w/ sterile water  and/or 0.12% chlohexidine gluconate of extraction site. Alveolar bone compression applied at site of extraction. Placed moistened 2x2 guaze at extraction site with instructions to remove in 20-30 minutes unless excessive bleeding noted.  Surgical foam/plug placed: NA Surgical procedure: NA and surgical handpiece used to section tooth Bonegraft Placed: NO Suture type: No Suture Placed No brisk bleeding observed. No observation of buccal or lingual plate fracture. Patient dismissed in stable condition. PRESCRIPTION: none #20 RRT, warrants handpiece to section root for removal due to limited PDL and root tip easily chipping during removal.  Additional notes: Patient was given post op instructions and was told to alternate tylenol  and ibuprofen for pain.   Plan: Discussed with patient the importance of preserving the clot in socket by avoiding for a minimum of 72 hours: spitting, swishing, use of mouthwash, use of peroxide, use of straws, and smoking.  Reviewed post op instructions  and answered all of patient's questions. Prescribed the following medications: PRESCRIPTION: none Advised patient, if after being dismissed patient notes excessive bleeding to contact our office immediately or, if unable to reach our office, report to urgent care/emergency room as needed. Patient will return on: 10/05/2023  for crown on 5 Patient expressed understanding of all that was discussed.   Treatment Providers Dental Assistant: Myra R.  Ronal Fellows Paganucci, DMD2 Selinda Benne, DDS  [facility] [address] [place] [address] [phone]        [1] Past Medical History: Diagnosis Date  . Acid reflux   . Depression   [2] Current Outpatient Medications on File Prior to Visit  Medication Sig Dispense Refill  . albuterol  HFA (PROVENTIL  HFA;VENTOLIN  HFA) 90 mcg/actuation inhaler if needed.    . ALPRAZolam (XANAX) 0.25 mg tablet 1/2-1 TABLET ORALLY NIGHTLY AS NEEDED.    . aspirin  325 mg EC tablet TAKE 1 TABLET BY MOUTH TWICE A DAY TO PREVENT BLOOD CLOTS    . azithromycin (ZITHROMAX) 250 mg tablet TAKE 2 TABLETS BY MOUTH TODAY, THEN TAKE 1 TABLET DAILY FOR 4 DAYS AS DIRECTED    . citalopram  (CeleXA ) 40 mg tablet Take 40 mg by mouth 1 (one) time each day.    . citalopram  (CeleXA ) 40 mg tablet Take 1 tablet by mouth 1 (one) time each day.    . clobetasoL -emollient 0.05 % cream Apply 2 Applications topically if needed.    . ergocalciferol  (VITAMIN D -2) 1,250 mcg (50,000 unit) capsule Take 50,000 Units by mouth 1 (one) time per week.    . ergocalciferol  (VITAMIN D -2) 1,250 mcg (50,000 unit) capsule Take 1 capsule by mouth 1 (one) time per week.    . ergocalciferol  (Vitamin D2) 1,250 mcg (50,000 unit) capsule     . estradioL  (ESTRACE ) 0.01 % (0.1 mg/gram) vaginal cream PLACE 0.5 G VAGINALLY 2 (TWO) TIMES A WEEK. PLACE 0.5G NIGHTLY FOR TWO WEEKS THEN TWICE  A WEEK AFTER    . estradioL  (ESTRACE ) 0.01 % (0.1 mg/gram) vaginal cream Insert 0.5 g into the vagina.     . fluticasone  furoate-vilanteroL (BREO ELIPTA) 200-25 mcg/dose inhaler INHALE 1 PUFF DAILY    . fluticasone  propionate (FLONASE ) 50 mcg/actuation nasal spray Administer 1 spray into affected nostril(s) 1 (one) time each day.    . fluticasone  propionate (FLONASE ) 50 mcg/actuation nasal spray Administer 1 spray into each nostril 1 (one) time each day.    . furosemide  (LASIX ) 20 mg tablet 1 (one) time each day.    SABRA HYDROcodone-acetaminophen  (NORCO) 5-325 mg tablet TAKE 1 TABLET BY MOUTH EVERY 6 HOURS TO EVERY 8 HOURS AS NEEDED FOR 5 DAYS    . loratadine  (CLARITIN ) 10 mg tablet Take 10 mg by mouth 1 (one) time each day.    . loratadine  (CLARITIN ) 10 mg tablet Take 1 tablet by mouth 1 (one) time each day.    . montelukast  (SINGULAIR ) 10 mg tablet Take 10 mg by mouth every night.    . montelukast  (SINGULAIR ) 10 mg tablet Take 1 tablet by mouth 1 (one) time each day.    . mupirocin (BACTROBAN) 2 % ointment 1 APPLICATION TO AFFECTED AREA SMALL AMOUNT EXTERNALLY TWICE A DAY AS NEEDED    . nystatin  (MYCOSTATIN ) 100,000 unit/gram powder APPLY TOPICALLY 3 TIMES A DAY    . nystatin  (MYCOSTATIN ) cream APPLY TO AFFECTED AREA TWICE A DAY FOR 7 DAYS - MAY REPEAT IF NEEDED    . oxyCODONE  (ROXICODONE ) 5 mg immediate release tablet Take 1 tablet every 6-8 hours by oral route as needed.    . pantoprazole  (Protonix ) 20 mg EC tablet Take by mouth 1 (one) time each day.    . pantoprazole  (PROTONIX ) 40 mg EC tablet Take 40 mg by mouth 1 (one) time each day.    . pantoprazole  (PROTONIX ) 40 mg EC tablet Take 1 tablet by mouth 1 (one) time each day.    . potassium chloride  (KLOR-CON ) 10 mEq CR tablet Take 10 mEq by mouth 1 (one) time each day. with food    . potassium chloride  (MICRO-K ) 10 mEq CR capsule if needed.    . predniSONE (DELTASONE) 10 mg tablet TAKE 6 TABLETS ON DAY 1 AS DIRECTED ON PACKAGE AND DECREASE BY 1 TAB EACH DAY FOR A TOTAL OF 6 DAYS    . predniSONE (DELTASONE) 20 mg tablet Take 1 tablet by mouth 1  (one) time each day.    . predniSONE 10 mg tablets,dose pack TAKE 6 TABLETS ON DAY 1 AS DIRECTED ON PACKAGE AND DECREASE BY 1 TAB EACH DAY FOR A TOTAL OF 6 DAYS    . promethazine (PHENERGAN) 12.5 mg tablet TAKE 1 TABLET BY MOUTH EVERY 8-12 HOURS AS NEEDED FOR NAUSEA/VOMITING    . promethazine-DM (PROMETHAZINE-DM) 6.25-15 mg/5 mL syrup 5ML EVERY 12 HOURS AS NEEDED FOR COUGH    . traMADoL (ULTRAM) 50 mg tablet Take 1-2 Tablets bid prn pain     No current facility-administered medications on file prior to visit.  [3] Allergies Allergen Reactions  . Adhesive Tape-Silicones     Other Reaction(s): Not available  unknown  . Omeprazole Itching  . Prednisone     Other Reaction(s): confusion  confusion  . Ampicillin Hives and Rash  . Penicillins Itching and Rash    Has patient had a PCN reaction causing immediate rash, facial/tongue/throat swelling, SOB or lightheadedness with hypotension: Unknown  Has patient had a PCN reaction causing severe rash involving mucus membranes  or skin necrosis: Unknown  Has patient had a PCN reaction that required hospitalization: No  Has patient had a PCN reaction occurring within the last 10 years: No  If all of the above answers are NO, then may proceed with Cephalosporin use.  Product containing penicillin (product)  [4] Patient Active Problem List Diagnosis  . Lumbar radiculopathy, right  . Closed fracture of right distal radius  . Decreased ROM of wrist  . Diverticulosis of colon  . Obesity (BMI 30-39.9)  . Osteoarthritis of knee  . Pain and swelling of right wrist  . Pedal edema  . Pre-operative cardiovascular examination  . Arthralgia of left knee  . History of total right knee replacement  . Injury of right knee  . Pain of right hip joint  . Pain of left hip joint  . Closed fracture of distal end of right radius  . Decreased range of motion of wrist  . Thoracic and lumbosacral neuritis  . Obesity with body mass index 30 or greater  .  Osteoarthritis of left knee  . Pain in wrist  . Edema of foot  . Lumbar radiculopathy, right  . Primary osteoarthritis of left knee  . Aftercare following joint replacement surgery  [5] Past Surgical History: Procedure Laterality Date  . JOINT REPLACEMENT    . KNEE SURGERY Left 11/23/2022  . PARTIAL HYSTERECTOMY N/A 1977  . TOTAL KNEE ARTHROPLASTY Right 2019  [6] Family History Problem Relation Name Age of Onset  . Hypertension Mother  57  . Heart failure Mother  10  . Kidney disease Father  37       Partial kidney function  . COPD Sister  32       From smoking  . Hypertension Mother's Sister  11  [7] Social History Tobacco Use  . Smoking status: Never  . Smokeless tobacco: Never  Substance Use Topics  . Alcohol use: Never  . Drug use: Never

## 2023-11-25 ENCOUNTER — Ambulatory Visit: Admitting: Internal Medicine

## 2024-02-02 ENCOUNTER — Encounter: Payer: Self-pay | Admitting: Internal Medicine

## 2024-02-02 ENCOUNTER — Ambulatory Visit: Admitting: Internal Medicine

## 2024-02-02 VITALS — BP 100/60 | HR 60 | Temp 98.8°F | Ht 61.0 in | Wt 224.0 lb

## 2024-02-02 DIAGNOSIS — J309 Allergic rhinitis, unspecified: Secondary | ICD-10-CM

## 2024-02-02 DIAGNOSIS — R600 Localized edema: Secondary | ICD-10-CM | POA: Diagnosis not present

## 2024-02-02 DIAGNOSIS — R0683 Snoring: Secondary | ICD-10-CM

## 2024-02-02 DIAGNOSIS — Z6841 Body Mass Index (BMI) 40.0 and over, adult: Secondary | ICD-10-CM | POA: Diagnosis not present

## 2024-02-02 DIAGNOSIS — G4710 Hypersomnia, unspecified: Secondary | ICD-10-CM | POA: Diagnosis not present

## 2024-02-02 DIAGNOSIS — R0602 Shortness of breath: Secondary | ICD-10-CM

## 2024-02-02 DIAGNOSIS — R0609 Other forms of dyspnea: Secondary | ICD-10-CM

## 2024-02-02 DIAGNOSIS — J411 Mucopurulent chronic bronchitis: Secondary | ICD-10-CM

## 2024-02-02 DIAGNOSIS — E669 Obesity, unspecified: Secondary | ICD-10-CM

## 2024-02-02 DIAGNOSIS — G4733 Obstructive sleep apnea (adult) (pediatric): Secondary | ICD-10-CM

## 2024-02-02 DIAGNOSIS — R609 Edema, unspecified: Secondary | ICD-10-CM

## 2024-02-02 MED ORDER — FLUTICASONE-SALMETEROL 230-21 MCG/ACT IN AERO: 2.0000 | INHALATION_SPRAY | Freq: Two times a day (BID) | RESPIRATORY_TRACT | 12 refills | Status: AC

## 2024-02-02 NOTE — Patient Instructions (Addendum)
 Continue medications with prednisone and doxycycline as prescribed Start Advair HFA 2 puffs in the morning and 2 puffs at night Please rinse mouth after every use We will plan to obtain chest x-ray We will plan to obtain pulmonary function testing to assess lung function Recommend home sleep study to assess for sleep apnea Will obtain echocardiogram to look at your heart  Avoid Allergens and Irritants Avoid secondhand smoke Avoid SICK contacts Recommend  Masking  when appropriate Recommend Keep up-to-date with vaccinations'

## 2024-02-02 NOTE — Progress Notes (Signed)
 " [facility] [place] [facility] Consultation      Date: 02/02/2024,   MRN# [id number] Janet Diaz 1945/07/19     CHIEF COMPLAINT:   Assessment of shortness of breath   HISTORY OF PRESENT ILLNESS  79 year old pleasant white female seen today for ongoing symptoms of chronic cough congestion chronic bronchitis wheezing with allergic rhinitis This has been going on since November 2024 which coincided with surgical replacement of her knee  Patient also has on and off GERD which she takes Protonix  on and off Patient is a non-smoker however had extensive secondhand smoke exposure  Patient was seen and given doxycycline and prednisone 2 days ago Her symptoms seem to be improving This leads me to believe that she may have underlying COPD and asthma exacerbation which is getting better and his steroid responsive  Other symptoms include excessive daytime sleepiness along with excessive fatigue and tiredness Patient has frequent naps throughout the day Patient has been known to snore Patient does have witnessed apneas  Patient has seen a pulmonologist in the past and she was noted to have a lung nodule in the 1980s No recent CT scans noted Chest x-ray 2024 no significant findings  Patient also has lower extremity edema, and coupled with shortness of breath will need cardiology referral and echocardiogram   Patient is seen today for problems and issues with sleep related to excessive daytime sleepiness Patient  has been having sleep problems for many years Patient has been having excessive daytime sleepiness for a long time Patient has been having extreme fatigue and tiredness, lack of energy +  very Loud snoring every night + struggling breathe at night and gasps for air   Discussed sleep data and reviewed with patient.  Encouraged proper weight management.  Discussed driving precautions and its relationship with hypersomnolence.  Discussed operating dangerous equipment and its  relationship with hypersomnolence.  Discussed sleep hygiene, and benefits of a fixed sleep waked time.  The importance of getting eight or more hours of sleep discussed with patient.  Discussed limiting the use of the computer and television before bedtime.  Decrease naps during the day, so night time sleep will become enhanced.       PAST MEDICAL HISTORY   Past Medical History:  Diagnosis Date   Acute bronchitis    Allergic rhinosinusitis    Anxiety and depression    Basal cell carcinoma    Nose   Candida infection of flexural skin    Carpal tunnel syndrome    Complication of anesthesia    hard time waking up , And BP went upwith a finger surgery,   Dyslipidemia    Dyspnea    GERD (gastroesophageal reflux disease)    Laryngitis    LBBB (left bundle branch block)    OAB (overactive bladder)    Osteoarthritis of right knee    Pedal edema    PONV (postoperative nausea and vomiting)    Pre-diabetes    Vitamin B 12 deficiency    Vitamin D  deficiency      SURGICAL HISTORY   Past Surgical History:  Procedure Laterality Date   ABDOMINAL HYSTERECTOMY  1975   CARPAL TUNNEL RELEASE  2000   CATARACT EXTRACTION  2014   FINGER SURGERY  2016   JOINT REPLACEMENT     Right total knee Dr. Hiram  6/24    KNEE SURGERY     PARTIAL HYSTERECTOMY  1975   has ovaries   torn cartilage     TOTAL  KNEE ARTHROPLASTY Right 07/05/2017   Procedure: RIGHT TOTAL KNEE ARTHROPLASTY;  Surgeon: Melodi Lerner, MD;  Location: [facility];  Service: Orthopedics;  Laterality: Right;   TOTAL KNEE ARTHROPLASTY Left 11/23/2022   Procedure: TOTAL KNEE ARTHROPLASTY;  Surgeon: Melodi Lerner, MD;  Location: [facility];  Service: Orthopedics;  Laterality: Left;   WRIST SURGERY Left 2014     FAMILY HISTORY   Family History  Problem Relation Age of Onset   Heart disease Mother    Hypertension Mother    Stroke Mother    Heart attack Mother    Prostate cancer Paternal  Grandfather      SOCIAL HISTORY   Social History[1]   MEDICATIONS    Home Medication:  Current Outpatient Rx   Order #: [id number] Class: Historical Med   Order #: [id number] Class: Historical Med   Order #: [id number] Class: Historical Med   Order #: [id number] Class: Historical Med   Order #: [id number] Class: Historical Med   Order #: [id number] Class: Historical Med   Order #: [id number] Class: Historical Med   Order #: [id number] Class: Historical Med   Order #: [id number] Class: Normal   Order #: [id number] Class: Historical Med   Order #: [id number] Class: Historical Med   Order #: [id number] Class: Historical Med   Order #: [id number] Class: Normal   Order #: [id number] Class: Historical Med   Order #: [id number] Class: Normal   Order #: [id number] Class: Normal   Order #: [id number] Class: Normal   Order #: [id number] Class: Historical Med   Order #: [id number] Class: Historical Med   Order #: [id number] Class: Historical Med    Current Medication: Current Medications[2]    ALLERGIES   Omeprazole, Prednisone, Tape, and Ampicillin  BP 100/60   Pulse 60   Temp 98.8 F (37.1 C)   Ht 5' 1 (1.549 m)   Wt 224 lb (101.6 kg)   SpO2 95%   BMI 42.32 kg/m     Review of Systems: Gen:  Denies  fever, sweats, chills weight loss  HEENT: Denies blurred vision, double vision, ear pain, eye pain, hearing loss, nose bleeds, sore throat Cardiac:  No dizziness, chest pain or heaviness, chest tightness,edema, No JVD Resp:   + cough, -sputum production, +shortness of breath,+wheezing, -hemoptysis,  Other:  All other systems negative   Physical Examination:   General Appearance: No distress  EYES PERRLA, EOM intact.   NECK Supple, No JVD Pulmonary: normal breath sounds, No wheezing.  CardiovascularNormal S1,S2.  No m/r/g.   Abdomen: Benign, Soft, non-tender. Neurology UE/LE 5/5 strength, no focal deficits Ext + swelling ALL OTHER ROS ARE NEGATIVE      ASSESSMENT/PLAN    79 year old pleasant white female with signs and symptoms of shortness of breath dyspnea on exertion associated with coughing chest congestion wheezing with allergic rhinitis with a history of extensive secondhand smoke exposure and likely underlying diagnosis of obstructive airways disease in the setting of morbid obesity and deconditioned state associated with lower extremity edema possible cardiomyopathy, with uncontrolled reflux, patient with signs and symptoms of underlying snoring and excessive daytime sleepiness suggestive of obstructive sleep apnea  Shortness of breath chest congestion wheezing Likely underlying obstructive airways disease Continue antibiotics and prednisone as prescribed Recommend starting Advair HFA 2 puffs in the morning 2 puffs at night Continue albuterol  as needed Recommend repeat chest x-ray Recommend pulmonary function test  Shortness of breath dyspnea on exertion with lower extremity edema Recommend echocardiogram Recommend referral to cardiology  Assessment for sleep apnea Recommend home sleep study definitive diagnosis  Obesity -recommend significant weight loss -recommend changing diet  Deconditioned state -Recommend increased daily activity and exercise    MEDICATION ADJUSTMENTS/LABS AND TESTS ORDERED: Advair HFA 230 Albuterol  as needed HST PFT Chest x-ray Echocardiogram   CURRENT MEDICATIONS REVIEWED AT LENGTH WITH PATIENT TODAY   Patient  satisfied with Plan of action and management. All questions answered   Follow up 3 months   I spent a total of 85 minutes dedicated to the care of this patient on the date of this encounter to include pre-visit review of records, face-to-face time with the patient discussing conditions above, post visit ordering of testing, clinical documentation with the electronic health record, making appropriate referrals as documented, and communicating necessary information to the patient's healthcare team.     The Patient requires high complexity decision making for assessment and support, frequent evaluation and titration of therapies, application of advanced monitoring technologies and extensive interpretation of multiple databases.  Patient satisfied with Plan of action and management. All questions answered    Nickolas Alm Cellar, M.D.  [facility]  Medical Director [facility]                   [1] Social History Tobacco Use   Smoking status: Never   Smokeless tobacco: Never  Vaping Use   Vaping status: Never Used  Substance Use Topics   Alcohol use: No   Drug use: No  [2]  Current Outpatient Medications:    clobetasol  ointment (TEMOVATE ) 0.05 %, Apply 1 Application topically 2 (two) times daily., Disp: , Rfl:    doxycycline (VIBRAMYCIN) 100 MG capsule, Take 100 mg by mouth 2 (two) times daily., Disp: , Rfl:    methylPREDNISolone  (MEDROL  DOSEPAK) 4 MG TBPK tablet, Take 4 mg by mouth., Disp: , Rfl:    promethazine-dextromethorphan (PROMETHAZINE-DM) 6.25-15 MG/5ML syrup, Take 5 mLs by mouth every 4 (four) hours as needed., Disp: , Rfl:    albuterol  (VENTOLIN  HFA) 108 (90 Base) MCG/ACT inhaler, Inhale 2 puffs into the lungs every 6 (six) hours as needed for shortness of breath or wheezing., Disp: , Rfl:    aspirin  81 MG chewable tablet, Chew 81 mg by mouth daily., Disp: , Rfl:    citalopram  (CELEXA ) 40 MG tablet, Take 20 mg by mouth at bedtime., Disp: , Rfl: 0   ergocalciferol  (VITAMIN D2) 50000 units capsule, Take 50,000 Units by mouth every Wednesday. AT BEDTIME, Disp: , Rfl:    estradiol  (ESTRACE ) 0.1 MG/GM vaginal cream, Place 0.5 g vaginally 2 (two) times a week. Place 0.5g nightly for two weeks then twice a week after (Patient taking differently: Place 0.5 g vaginally daily as needed (dryness).), Disp: 42.5 g, Rfl: 11   fluticasone  (FLONASE ) 50 MCG/ACT nasal spray, Place 1 spray into both nostrils 2 (two) times  daily as needed for allergies., Disp: , Rfl:    furosemide  (LASIX ) 20 MG tablet, Take 20 mg by mouth daily as needed for edema., Disp: , Rfl:    loratadine  (CLARITIN ) 10 MG tablet, Take 10 mg by mouth daily., Disp: , Rfl:    methocarbamol  (ROBAXIN ) 500 MG tablet, Take 1 tablet (500 mg total) by mouth every 6 (six) hours as needed for muscle spasms., Disp: 40 tablet, Rfl: 0   montelukast  (SINGULAIR ) 10 MG tablet, Take 10 mg by mouth at bedtime., Disp: , Rfl:    nystatin  (MYCOSTATIN /NYSTOP ) powder, Apply 1 Application topically 3 (three) times daily. (Patient taking differently: Apply 1 Application topically 3 (three) times daily as needed (rash).), Disp: 15 g, Rfl: 0  ondansetron  (ZOFRAN ) 4 MG tablet, Take 1 tablet (4 mg total) by mouth every 6 (six) hours as needed for nausea., Disp: 20 tablet, Rfl: 0   oxyCODONE  (OXY IR/ROXICODONE ) 5 MG immediate release tablet, Take 1-2 tablets (5-10 mg total) by mouth every 6 (six) hours as needed for severe pain (pain score 7-10)., Disp: 42 tablet, Rfl: 0   pantoprazole  (PROTONIX ) 40 MG tablet, Take 40 mg by mouth daily before breakfast. , Disp: , Rfl:    potassium chloride  (MICRO-K ) 10 MEQ CR capsule, Take 10 mEq by mouth daily as needed (when taking lasix )., Disp: , Rfl:    predniSONE (DELTASONE) 20 MG tablet, Take 20 mg by mouth 2 (two) times daily., Disp: , Rfl:  "

## 2024-02-03 NOTE — Addendum Note (Signed)
 Addended byBETHA ISAIAH LENIS on: 02/03/2024 12:31 PM   Modules accepted: Level of Service

## 2024-02-18 ENCOUNTER — Ambulatory Visit: Admission: RE | Admit: 2024-02-18 | Source: Ambulatory Visit

## 2024-02-18 ENCOUNTER — Ambulatory Visit: Admission: RE | Admit: 2024-02-18 | Source: Ambulatory Visit | Admitting: Internal Medicine

## 2024-02-18 DIAGNOSIS — R0600 Dyspnea, unspecified: Secondary | ICD-10-CM

## 2024-02-18 DIAGNOSIS — R0602 Shortness of breath: Secondary | ICD-10-CM

## 2024-02-18 DIAGNOSIS — R609 Edema, unspecified: Secondary | ICD-10-CM

## 2024-02-18 DIAGNOSIS — R600 Localized edema: Secondary | ICD-10-CM

## 2024-02-18 LAB — ECHOCARDIOGRAM COMPLETE
AR max vel: 3.26 cm2
AV Area VTI: 3.46 cm2
AV Area mean vel: 3.03 cm2
AV Mean grad: 3 mmHg
AV Peak grad: 5.4 mmHg
Ao pk vel: 1.17 m/s
Area-P 1/2: 3.4 cm2
MV VTI: 2.86 cm2
S' Lateral: 2.7 cm

## 2024-02-18 NOTE — Progress Notes (Signed)
*  PRELIMINARY RESULTS* Echocardiogram 2D Echocardiogram has been performed.  Floydene Harder 02/18/2024, 11:46 AM

## 2024-05-25 ENCOUNTER — Ambulatory Visit: Admitting: Internal Medicine

## 2024-05-25 ENCOUNTER — Encounter
